# Patient Record
Sex: Female | Born: 1950 | Race: White | Hispanic: No | Marital: Married | Smoking: Never smoker
Health system: Southern US, Community
[De-identification: ages and names within clinical notes are randomized; demographics above are authoritative.]

## PROBLEM LIST (undated history)

## (undated) DIAGNOSIS — I2510 Atherosclerotic heart disease of native coronary artery without angina pectoris: Secondary | ICD-10-CM

## (undated) DIAGNOSIS — M1990 Unspecified osteoarthritis, unspecified site: Secondary | ICD-10-CM

---

## 1998-12-11 ENCOUNTER — Ambulatory Visit (HOSPITAL_COMMUNITY): Admission: RE | Admit: 1998-12-11 | Discharge: 1998-12-11 | Payer: Self-pay | Admitting: Internal Medicine

## 1998-12-11 ENCOUNTER — Encounter: Payer: Self-pay | Admitting: Internal Medicine

## 1999-09-09 ENCOUNTER — Inpatient Hospital Stay (HOSPITAL_COMMUNITY): Admission: RE | Admit: 1999-09-09 | Discharge: 1999-09-10 | Payer: Self-pay | Admitting: Gynecology

## 1999-09-09 ENCOUNTER — Encounter (INDEPENDENT_AMBULATORY_CARE_PROVIDER_SITE_OTHER): Payer: Self-pay | Admitting: Specialist

## 1999-12-29 ENCOUNTER — Encounter: Payer: Self-pay | Admitting: Internal Medicine

## 1999-12-29 ENCOUNTER — Ambulatory Visit (HOSPITAL_COMMUNITY): Admission: RE | Admit: 1999-12-29 | Discharge: 1999-12-29 | Payer: Self-pay | Admitting: Internal Medicine

## 2000-09-07 ENCOUNTER — Other Ambulatory Visit: Admission: RE | Admit: 2000-09-07 | Discharge: 2000-09-07 | Payer: Self-pay | Admitting: Gynecology

## 2001-01-02 ENCOUNTER — Encounter: Payer: Self-pay | Admitting: Internal Medicine

## 2001-01-02 ENCOUNTER — Ambulatory Visit (HOSPITAL_COMMUNITY): Admission: RE | Admit: 2001-01-02 | Discharge: 2001-01-02 | Payer: Self-pay | Admitting: Internal Medicine

## 2001-01-12 ENCOUNTER — Emergency Department (HOSPITAL_COMMUNITY): Admission: EM | Admit: 2001-01-12 | Discharge: 2001-01-12 | Payer: Self-pay | Admitting: Emergency Medicine

## 2001-09-07 ENCOUNTER — Other Ambulatory Visit: Admission: RE | Admit: 2001-09-07 | Discharge: 2001-09-07 | Payer: Self-pay | Admitting: Gynecology

## 2002-01-04 ENCOUNTER — Ambulatory Visit (HOSPITAL_COMMUNITY): Admission: RE | Admit: 2002-01-04 | Discharge: 2002-01-04 | Payer: Self-pay | Admitting: Internal Medicine

## 2002-01-04 ENCOUNTER — Encounter: Payer: Self-pay | Admitting: Internal Medicine

## 2002-06-14 ENCOUNTER — Encounter (INDEPENDENT_AMBULATORY_CARE_PROVIDER_SITE_OTHER): Payer: Self-pay | Admitting: Specialist

## 2002-06-14 ENCOUNTER — Ambulatory Visit (HOSPITAL_BASED_OUTPATIENT_CLINIC_OR_DEPARTMENT_OTHER): Admission: RE | Admit: 2002-06-14 | Discharge: 2002-06-14 | Payer: Self-pay | Admitting: Plastic Surgery

## 2002-09-12 ENCOUNTER — Other Ambulatory Visit: Admission: RE | Admit: 2002-09-12 | Discharge: 2002-09-12 | Payer: Self-pay | Admitting: Gynecology

## 2003-01-07 ENCOUNTER — Ambulatory Visit (HOSPITAL_COMMUNITY): Admission: RE | Admit: 2003-01-07 | Discharge: 2003-01-07 | Payer: Self-pay | Admitting: Gynecology

## 2003-01-07 ENCOUNTER — Encounter: Payer: Self-pay | Admitting: Gynecology

## 2003-09-17 ENCOUNTER — Other Ambulatory Visit: Admission: RE | Admit: 2003-09-17 | Discharge: 2003-09-17 | Payer: Self-pay | Admitting: Gynecology

## 2004-01-09 ENCOUNTER — Ambulatory Visit (HOSPITAL_COMMUNITY): Admission: RE | Admit: 2004-01-09 | Discharge: 2004-01-09 | Payer: Self-pay | Admitting: Gynecology

## 2004-08-16 ENCOUNTER — Emergency Department (HOSPITAL_COMMUNITY): Admission: EM | Admit: 2004-08-16 | Discharge: 2004-08-16 | Payer: Self-pay | Admitting: Emergency Medicine

## 2004-10-06 ENCOUNTER — Other Ambulatory Visit: Admission: RE | Admit: 2004-10-06 | Discharge: 2004-10-06 | Payer: Self-pay | Admitting: Gynecology

## 2005-01-13 ENCOUNTER — Ambulatory Visit (HOSPITAL_COMMUNITY): Admission: RE | Admit: 2005-01-13 | Discharge: 2005-01-13 | Payer: Self-pay | Admitting: Gynecology

## 2005-11-01 ENCOUNTER — Other Ambulatory Visit: Admission: RE | Admit: 2005-11-01 | Discharge: 2005-11-01 | Payer: Self-pay | Admitting: Gynecology

## 2006-01-03 ENCOUNTER — Encounter: Admission: RE | Admit: 2006-01-03 | Discharge: 2006-01-03 | Payer: Self-pay | Admitting: Internal Medicine

## 2006-01-20 ENCOUNTER — Ambulatory Visit (HOSPITAL_COMMUNITY): Admission: RE | Admit: 2006-01-20 | Discharge: 2006-01-20 | Payer: Self-pay | Admitting: Internal Medicine

## 2006-11-03 ENCOUNTER — Other Ambulatory Visit: Admission: RE | Admit: 2006-11-03 | Discharge: 2006-11-03 | Payer: Self-pay | Admitting: Gynecology

## 2007-01-23 ENCOUNTER — Ambulatory Visit (HOSPITAL_COMMUNITY): Admission: RE | Admit: 2007-01-23 | Discharge: 2007-01-23 | Payer: Self-pay | Admitting: Gynecology

## 2007-02-02 ENCOUNTER — Encounter: Admission: RE | Admit: 2007-02-02 | Discharge: 2007-02-02 | Payer: Self-pay | Admitting: Gynecology

## 2007-07-31 ENCOUNTER — Encounter: Admission: RE | Admit: 2007-07-31 | Discharge: 2007-07-31 | Payer: Self-pay | Admitting: Sports Medicine

## 2007-08-14 ENCOUNTER — Encounter: Admission: RE | Admit: 2007-08-14 | Discharge: 2007-08-14 | Payer: Self-pay | Admitting: Sports Medicine

## 2007-11-07 ENCOUNTER — Other Ambulatory Visit: Admission: RE | Admit: 2007-11-07 | Discharge: 2007-11-07 | Payer: Self-pay | Admitting: Gynecology

## 2008-01-24 ENCOUNTER — Encounter: Admission: RE | Admit: 2008-01-24 | Discharge: 2008-01-24 | Payer: Self-pay | Admitting: Gynecology

## 2008-03-22 ENCOUNTER — Encounter: Admission: RE | Admit: 2008-03-22 | Discharge: 2008-03-22 | Payer: Self-pay | Admitting: Sports Medicine

## 2008-04-08 ENCOUNTER — Encounter: Admission: RE | Admit: 2008-04-08 | Discharge: 2008-04-08 | Payer: Self-pay | Admitting: Sports Medicine

## 2012-04-03 ENCOUNTER — Ambulatory Visit (INDEPENDENT_AMBULATORY_CARE_PROVIDER_SITE_OTHER): Payer: BC Managed Care – PPO | Admitting: Sports Medicine

## 2012-04-03 VITALS — BP 130/78 | Ht 65.0 in | Wt 120.0 lb

## 2012-04-03 DIAGNOSIS — M4800 Spinal stenosis, site unspecified: Secondary | ICD-10-CM

## 2012-04-03 DIAGNOSIS — M722 Plantar fascial fibromatosis: Secondary | ICD-10-CM

## 2012-04-03 NOTE — Progress Notes (Signed)
  Subjective:    Patient ID: Beth Clark, female    DOB: January 23, 1951, 61 y.o.   MRN: [id number]  HPI chief complaint: Left leg and left heel pain  Patient comes in today complaining of 6 months of left heel pain. Pain is worse first thing in the morning as well as at the end of the day. No trauma. She's not noticed any swelling. She notices that the pain improves with wearing Birkenstocks. She's also complaining of intermittent tingling up the back of her left calf. No weakness. Tingling seems to be most pronounced with activity. She has a history of spinal stenosis which caused symptoms down the right leg. The symptoms improved with lumbar epidural steroid injections and physical therapy. This was in [id number].Current symptoms in the left leg are a little different than what she experienced in the right. She notices improvement with Mobic but states that she's only able to take it about twice a week do to it causing bloody stools. She's been icing her heel with a frozen water bottle but is otherwise not had any specific treatment.    Review of Systems     Objective:   Physical Exam Well-developed, well-nourished. No acute distress  Neurological exam shows a negative straight leg raise bilaterally. No atrophy of either lower extremity. Reflexes are 3/4 at the Achilles and patellar tendons bilaterally. Strength is 5/5 both lower extremities. Sensation is intact to light-touch.  Left heel: There is tenderness to palpation at the calcaneal insertion of the plantar fascia. Negative calcaneal squeeze. No tenderness to palpation over the medial or lateral malleolus. No soft tissue swelling. Neurovascular intact distally. She walks with an obvious limp. And has a flexible cavus foot.  MSK ultrasound: Limited views of the left heel were obtained in longitudinal view. There is an area of hypoechogenicity at the calcaneal insertion of the plantar fascia. There is a small spur. Plantar fascia measures 3  mm.       Assessment & Plan:  1. Left heel pain secondary to plantar fasciitis 2. Intermittent left leg numbness and tingling likely due to spinal stenosis  Sports insoles and plantar fascial stretches for the left heel. Daily icing with a heel submerged in an ice bath. Arch strap. I recommended good cushioned shoes with walking. For her left leg symptoms, They are not severe enough now to warrant reimaging or ESIs. I discussed the possibility of physical therapy but she wants to hold on that for now. Return to the clinic in 4 weeks.

## 2012-05-01 ENCOUNTER — Ambulatory Visit: Payer: BC Managed Care – PPO | Admitting: Sports Medicine

## 2012-05-03 ENCOUNTER — Ambulatory Visit (INDEPENDENT_AMBULATORY_CARE_PROVIDER_SITE_OTHER): Payer: BC Managed Care – PPO | Admitting: Sports Medicine

## 2012-05-03 VITALS — BP 122/70 | Ht 64.0 in | Wt 120.0 lb

## 2012-05-03 DIAGNOSIS — M25569 Pain in unspecified knee: Secondary | ICD-10-CM | POA: Insufficient documentation

## 2012-05-03 DIAGNOSIS — Q667 Congenital pes cavus, unspecified foot: Secondary | ICD-10-CM

## 2012-05-03 DIAGNOSIS — M722 Plantar fascial fibromatosis: Secondary | ICD-10-CM

## 2012-05-03 NOTE — Progress Notes (Signed)
  Subjective:    Patient ID: Beth Clark, female    DOB: 24-Sep-1950, 61 y.o.   MRN: [id number]  HPI Patient comes in today for followup on left heel pain. Overall pain has improved but not resolved. She still has days where her heel is quite painful. She is wearing her sports insoles as well as her arch strap. She is diligent about doing her home exercises and is trying to be diligent about daily icing. She's beginning to get some mild left knee pain particularly in the anterior aspect of the knee. Pain is present only with squatting. No swelling. No mechanical symptoms. No problems with this knee in the past. The numbness and tingling she was getting down the left leg is now only intermittent and quite tolerable. She's not noticed any weakness.    Review of Systems     Objective:   Physical Exam Well-developed, well-nourished. No acute distress. Awake alert and oriented x3. Vital signs were reviewed and on the chart  Left knee shows full range of motion without an effusion. No tenderness along the patellar tendon. No significant patellofemoral crepitus, but she does have quite a bit of tethering of the patella laterally. Negative patellar grind. Good joint stability. No joint line tenderness. Negative McMurray's. Left foot exam still shows a rather cavus foot. She remains tender to palpation at the calcaneal insertion of the plantar fascia. Negative calcaneal squeeze. Walking without a limp. Neurological exam shows a negative straight leg raise bilaterally. Strength and reflexes are equal bilaterally.       Assessment & Plan:  1. Plantar fasciitis 2. Pes cavus foot 3. 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 Lumbar degenerative disc  Plantar fasciitis is improving. Continue with current treatment, but we will schedule the patient for an appointment next week for custom orthotics. For her left knee pain I gave her a body helix patellar strap. If pain persists  she will need to avoid squats in the gym. Her radiculopathy is currently tolerable. We can consider updated imaging at a later date if symptoms become worse. She understands.

## 2012-05-09 ENCOUNTER — Ambulatory Visit (INDEPENDENT_AMBULATORY_CARE_PROVIDER_SITE_OTHER): Payer: BC Managed Care – PPO | Admitting: Family Medicine

## 2012-05-09 VITALS — BP 120/80 | Ht 64.0 in | Wt 120.0 lb

## 2012-05-09 DIAGNOSIS — R269 Unspecified abnormalities of gait and mobility: Secondary | ICD-10-CM

## 2012-05-09 DIAGNOSIS — M722 Plantar fascial fibromatosis: Secondary | ICD-10-CM

## 2012-05-09 NOTE — Assessment & Plan Note (Signed)
Orthotics made with improvement in gait. Patient is going to increase the duration of wearing orthotics of and course of the next 2 weeks. Patient still has some gait abnormalities would consider focusing on hip abductor strengthening exercises. Patient will followup in 2-4 weeks.

## 2012-05-09 NOTE — Assessment & Plan Note (Signed)
Fitted with custom orthotics today. Patient to followup in 2-4 weeks with Dr. Margaretha Sheffield and have any corrections that would be needed. Encourage patient to increase her duration of wearing the orthotic slowly over the course of the next 2 weeks. Patient though she can bring them at any time to have any adjustments if necessary. Patient will continue her home stretching protocol as well as the anti-inflammatories. If patient does not have improvement in the next 4 weeks they're going to consider corticosteroid injection.

## 2012-05-09 NOTE — Progress Notes (Signed)
Subjective: Patient is here for followup of her plantar fasciitis and pedis cavus. Patient is here to have orthotics made. Patient has been doing the home stretches and icing protocol that was given to her by Dr. Margaretha Sheffield. Patient states that she is having minimal improvement since that time. Patient still says her left foot is worse than the right foot. Patient did do some walking yesterday and had significant pain the next day. Patient has been taking meloxicam with moderate benefit as well. No new symptoms.  Review of systems as stated above in history of present illness otherwise unremarkable  Physical exam: General: No apparent distress alert and oriented x3 Ankle: Bilaterally No visible erythema or swelling. Range of motion is full in all directions. Strength is 5/5 in all directions. Stable lateral and medial ligaments; squeeze test and kleiger test unremarkable; Talar dome nontender; No pain at base of 5th MT; No tenderness over cuboid; No tenderness over N spot or navicular prominence No tenderness on posterior aspects of lateral and medial malleolus No sign of peroneal tendon subluxations; Negative tarsal tunnel tinel's Able to walk 4 steps. Mild varus deformity of the hindfoot bilaterally and pedis cavus bilaterally. Otherwise foot is fairly unremarkable. Gait: Patient has an outtoeing with her gait as well as mild pronation but otherwise fairly unremarkable.   Patient was fitted for a : standard, cushioned, semi-rigid orthotic. The orthotic was heated and afterward the patient stood on the orthotic blank positioned on the orthotic stand. The patient was positioned in subtalar neutral position and 10 degrees of ankle dorsiflexion in a weight bearing stance. After completion of molding, a stable base was applied to the orthotic blank. The blank was ground to a stable position for weight bearing. Size:9 Base:EVA Posting:none Additional orthotic padding: had to sand down toe to  help with sizing in shoe.   Patient's gait improved significantly with much more neutral position with orthotics. Patient's outoeing also improved.

## 2012-06-07 ENCOUNTER — Ambulatory Visit: Payer: BC Managed Care – PPO | Admitting: Sports Medicine

## 2013-12-04 DIAGNOSIS — W57XXXA Bitten or stung by nonvenomous insect and other nonvenomous arthropods, initial encounter: Secondary | ICD-10-CM | POA: Diagnosis not present

## 2013-12-04 DIAGNOSIS — T148 Other injury of unspecified body region: Secondary | ICD-10-CM | POA: Diagnosis not present

## 2013-12-04 DIAGNOSIS — Z85828 Personal history of other malignant neoplasm of skin: Secondary | ICD-10-CM | POA: Diagnosis not present

## 2014-03-04 DIAGNOSIS — W57XXXA Bitten or stung by nonvenomous insect and other nonvenomous arthropods, initial encounter: Secondary | ICD-10-CM | POA: Diagnosis not present

## 2014-03-04 DIAGNOSIS — Z1231 Encounter for screening mammogram for malignant neoplasm of breast: Secondary | ICD-10-CM | POA: Diagnosis not present

## 2014-03-04 DIAGNOSIS — D239 Other benign neoplasm of skin, unspecified: Secondary | ICD-10-CM | POA: Diagnosis not present

## 2014-03-04 DIAGNOSIS — Z1289 Encounter for screening for malignant neoplasm of other sites: Secondary | ICD-10-CM | POA: Diagnosis not present

## 2014-03-04 DIAGNOSIS — D230 Other benign neoplasm of skin of lip: Secondary | ICD-10-CM | POA: Diagnosis not present

## 2014-03-04 DIAGNOSIS — Z01419 Encounter for gynecological examination (general) (routine) without abnormal findings: Secondary | ICD-10-CM | POA: Diagnosis not present

## 2014-03-04 DIAGNOSIS — Z85828 Personal history of other malignant neoplasm of skin: Secondary | ICD-10-CM | POA: Diagnosis not present

## 2014-03-04 DIAGNOSIS — D2330 Other benign neoplasm of skin of unspecified part of face: Secondary | ICD-10-CM | POA: Diagnosis not present

## 2014-03-04 DIAGNOSIS — L821 Other seborrheic keratosis: Secondary | ICD-10-CM | POA: Diagnosis not present

## 2014-03-04 DIAGNOSIS — T148 Other injury of unspecified body region: Secondary | ICD-10-CM | POA: Diagnosis not present

## 2014-03-04 DIAGNOSIS — D1801 Hemangioma of skin and subcutaneous tissue: Secondary | ICD-10-CM | POA: Diagnosis not present

## 2014-03-04 DIAGNOSIS — Z1212 Encounter for screening for malignant neoplasm of rectum: Secondary | ICD-10-CM | POA: Diagnosis not present

## 2014-05-01 ENCOUNTER — Encounter: Payer: Self-pay | Admitting: Sports Medicine

## 2014-05-01 ENCOUNTER — Ambulatory Visit (INDEPENDENT_AMBULATORY_CARE_PROVIDER_SITE_OTHER): Payer: Medicare Other | Admitting: Sports Medicine

## 2014-05-01 VITALS — BP 139/74 | Ht 65.0 in | Wt 125.0 lb

## 2014-05-01 DIAGNOSIS — M25569 Pain in unspecified knee: Secondary | ICD-10-CM | POA: Diagnosis not present

## 2014-05-01 DIAGNOSIS — R269 Unspecified abnormalities of gait and mobility: Secondary | ICD-10-CM | POA: Diagnosis not present

## 2014-05-01 DIAGNOSIS — M48061 Spinal stenosis, lumbar region without neurogenic claudication: Secondary | ICD-10-CM

## 2014-05-01 DIAGNOSIS — M2022 Hallux rigidus, left foot: Secondary | ICD-10-CM

## 2014-05-01 DIAGNOSIS — M2020 Hallux rigidus, unspecified foot: Secondary | ICD-10-CM

## 2014-05-01 DIAGNOSIS — M722 Plantar fascial fibromatosis: Secondary | ICD-10-CM | POA: Diagnosis not present

## 2014-05-02 NOTE — Progress Notes (Signed)
   Subjective:    Patient ID: Beth Clark, female    DOB: 03/07/1951, 63 y.o.   MRN: [id number]  HPI chief complaint: Bilateral foot pain  Patient comes in today complaining of bilateral foot pain, right greater than left. Her pain is diffuse but in the right foot she is also getting some discomfort in her right great toe. She feels like the mobility is less than in her left foot. She describes it as a "rubber band type sensation" around her toe. She has a history of spinal stenosis and has had previous lumbar epidural steroid injections. She notes some chronic decreased sensation along the right lower leg and into the great toe from her previous lumbar spine issues. She is very reactive and participate in a spinning class III days a week. She also does go to as well as a body sculpting class. The discomfort in her feet is worse first thing in the morning. She's not noticed any swelling. She was previously seen in the office back in September of 2013 for plantar fasciitis. That has resolved. She denies numbness and tingling in her feet but she does note some paresthesias in the left calf which is present when going from a seated to a standing position. This discomfort will resolve if she is able to shift her weight around. She feels like both of her legs are weak. She denies any groin pain. She has tried multiple oral and topical anti-inflammatories.  Interim medical history reviewed  Current medications reviewed Allergies reviewed    Review of Systems     Objective:   Physical Exam Well-developed, fit-appearing. No acute distress. Awake alert and oriented x3. Negative straight leg raise bilaterally. There is a slight amount of weakness with resisted great toe extension on the right compared to the left but otherwise her strength is 5/5 bilaterally. No noticeable atrophy. There is some decreased sensation along the L4 and L5 dermatomes in the right lower leg but this is chronic. Once again  appreciated is a cavus foot. She has hallux rigidus on the right but no palpable tenderness. No soft tissue swelling. No joint effusion. Walking without a limp.       Assessment & Plan:  Diffuse bilateral feet pain Right hallux rigidus History of spinal stenosis  I inspected her custom orthotics. They are in good shape. I added a first ray post to the right orthotic. I've also given her some green sports insoles to put in her spin shoes. I suspect that a lot of her diffuse discomfort is from the rigidity of the shoe. The paresthesias that she is experiencing intermittently in her left calf are likely originating from her lumbar spine given her history of spinal stenosis. I have requested the records from [facility] and I will see her back in the office in 4 weeks. I think she is okay to continue all activities as tolerated. If her paresthesias become more frequent or more constant or she begins to develop any weakness then I would consider repeating her MRI to reevaluate the degree of spinal stenosis present.

## 2014-06-03 ENCOUNTER — Ambulatory Visit (INDEPENDENT_AMBULATORY_CARE_PROVIDER_SITE_OTHER): Payer: Medicare Other | Admitting: Sports Medicine

## 2014-06-03 ENCOUNTER — Encounter: Payer: Self-pay | Admitting: Sports Medicine

## 2014-06-03 VITALS — BP 126/81 | HR 81 | Ht 64.0 in | Wt 120.0 lb

## 2014-06-03 DIAGNOSIS — M545 Low back pain, unspecified: Secondary | ICD-10-CM

## 2014-06-03 DIAGNOSIS — M79674 Pain in right toe(s): Secondary | ICD-10-CM

## 2014-06-04 ENCOUNTER — Encounter: Payer: Self-pay | Admitting: Sports Medicine

## 2014-06-04 ENCOUNTER — Ambulatory Visit
Admission: RE | Admit: 2014-06-04 | Discharge: 2014-06-04 | Disposition: A | Discharge status: Home / Self Care | Payer: Medicare Other | Source: Ambulatory Visit | Attending: Sports Medicine | Admitting: Sports Medicine

## 2014-06-04 DIAGNOSIS — M545 Low back pain, unspecified: Secondary | ICD-10-CM

## 2014-06-04 DIAGNOSIS — M79674 Pain in right toe(s): Secondary | ICD-10-CM | POA: Diagnosis not present

## 2014-06-04 DIAGNOSIS — M5136 Other intervertebral disc degeneration, lumbar region: Secondary | ICD-10-CM | POA: Diagnosis not present

## 2014-06-04 DIAGNOSIS — S99921A Unspecified injury of right foot, initial encounter: Secondary | ICD-10-CM | POA: Diagnosis not present

## 2014-06-04 DIAGNOSIS — M19071 Primary osteoarthritis, right ankle and foot: Secondary | ICD-10-CM | POA: Diagnosis not present

## 2014-06-04 NOTE — Progress Notes (Signed)
   Subjective:    Patient ID: Beth Clark, female    DOB: 06-17-1951, 63 y.o.   MRN: [id number]  HPI Patient comes in today for followup. She is still experiencing some "discomfort" in her right great toe. She describes it as a tightness and a "rubber bands tight feeling". No significant pain. No associated numbness or tingling although she does continue to complain of tingling in the left leg and left calf intermittently. Tingling is present mainly when going from a seated to a standing position. Resolves quickly with walking. No weakness. No pain with sitting. She does feel like a first ray post added to her orthotic on the right was helpful.  I was able to review the records from [facility]. In 2008 she had an MRI of her lumbar spine which showed moderate central and right foraminal stenosis with an associated posterior lateral disc bulge with compression of the right L5 nerve root. She underwent epidural steroid injections but despite this was left with some chronic mild numbness in the L5 nerve root distribution.    Review of Systems     Objective:   Physical Exam Well-developed, fit-appearing. No acute distress. Sitting comfortable in exam room  Negative straight leg raise bilaterally. Slight amount of weakness with great toe extension on the right compared to the left but not severe. Remainder of her strength is 5/5 in both lower extremities. Reflexes are equal at the Achilles and patellar tendons bilaterally. Some decreased sensation along the L5 dermatome on the right persists that this is chronic and there is no associated atrophy. Right great toe shows some mild hallux rigidus when compared to the left. No swelling. No tenderness to palpation. Patient walks without a limp.       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 We discussed working this up further with x-ray and MRI. Patient is in agreement with the x-ray but does not feel like her symptoms are severe enough toward a repeat MRI at this time. She understands that if her symptoms worsen or she begins to develop weakness in either leg then we will need to reconsider this. I'm also going to get x-rays of her right great toe to rule out possible osteoarthritis here. I will call her with the x-ray findings once available. She will continue with her orthotics and first ray post on the right with activity.

## 2014-06-06 ENCOUNTER — Telehealth: Payer: Self-pay | Admitting: Sports Medicine

## 2014-06-06 NOTE — Telephone Encounter (Signed)
I spoke with the patient on the phone today after reviewing x-rays of both her right great toe and her lumbar spine. Right great toe shows some mild to moderate degenerative changes at the MTP joint. Her lumbar spine shows some stable degenerative disc disease at L4-L5 as well as some facet arthropathy. In regards to her great toe she is having very little in the way of pain. Her symptoms may be due to the first MTP osteoarthritis or her symptoms may also be due to referred pain from her lumbar spine. Either way, her symptoms do not warrant more aggressive management at this time. She does understand that her spinal stenosis cannot be fully evaluated without doing a repeat MRI but as long as she is not having worsening symptoms I think that we can hold on that for now. She will followup if her symptoms worsen.

## 2014-06-30 DIAGNOSIS — Z23 Encounter for immunization: Secondary | ICD-10-CM | POA: Diagnosis not present

## 2014-11-01 DIAGNOSIS — Z0000 Encounter for general adult medical examination without abnormal findings: Secondary | ICD-10-CM | POA: Diagnosis not present

## 2014-11-01 DIAGNOSIS — E780 Pure hypercholesterolemia: Secondary | ICD-10-CM | POA: Diagnosis not present

## 2014-11-01 DIAGNOSIS — G4700 Insomnia, unspecified: Secondary | ICD-10-CM | POA: Diagnosis not present

## 2014-11-01 DIAGNOSIS — E559 Vitamin D deficiency, unspecified: Secondary | ICD-10-CM | POA: Diagnosis not present

## 2014-11-01 DIAGNOSIS — Z23 Encounter for immunization: Secondary | ICD-10-CM | POA: Diagnosis not present

## 2014-11-01 DIAGNOSIS — M899 Disorder of bone, unspecified: Secondary | ICD-10-CM | POA: Diagnosis not present

## 2014-11-01 DIAGNOSIS — M545 Low back pain: Secondary | ICD-10-CM | POA: Diagnosis not present

## 2014-11-04 ENCOUNTER — Ambulatory Visit (INDEPENDENT_AMBULATORY_CARE_PROVIDER_SITE_OTHER): Payer: Medicare Other | Admitting: Sports Medicine

## 2014-11-04 ENCOUNTER — Encounter: Payer: Self-pay | Admitting: Sports Medicine

## 2014-11-04 VITALS — BP 156/81 | HR 81 | Ht 64.0 in | Wt 126.0 lb

## 2014-11-04 DIAGNOSIS — M25511 Pain in right shoulder: Secondary | ICD-10-CM | POA: Diagnosis not present

## 2014-11-04 NOTE — Progress Notes (Signed)
  MAICEY BARRIENTEZ - 64 y.o. female MRN [id number]  Date of birth: [birth year]  SUBJECTIVE:  Including CC & ROS.  No chief complaint on file.  Anelia Carriveau Lyman is a 64 y.o. presents for evaluation of right shoulder pain. States pain started about 1 year ago. Pain located primarily front/top of shoulder. Occurs primarily with specific exercises like chest presses, some over the shoulder weight exercises. She only notices the pain during her everyday routine when she is reaching behind her back to pull covers over herself in bed. She has taken time off from exercises that cause her issue but when returning to them she again has a similar pain. It has not gotten worse, but came to be evaluated because it has not gotten any better. She takes meloxicam about once a week and aleve several times a week, she is not sure if it has helped at all   HISTORY: Past Medical, Surgical, Social, and Family History Reviewed & Updated per EMR.   Pertinent Historical Findings include: none  PHYSICAL EXAM:  VS: BP:(!) 156/81 mmHg  HR:81bpm  TEMP: ( )  RESP:   HT:5\' 4"  (162.6 cm)   WT:126 lb (57.153 kg)  BMI:21.7 PHYSICAL EXAM: General: NAD Shoulder exam: INSPECTION: prominent clavicles bilaterally.  PALPATION: mild tenderness primarily AC joint RANGE OF MOTION: intact STRENGTH: 5/5 arm extension/flexion, shoulder abduction/adduction, shoulder internal/external rotation SPECIAL TESTS: AC adduction test equivocal/mild pain on the right. Negative empty can. Hawkins test positive on first attempt, negative on second. Negative O'Briens. NEUROVASCULAR STATUS: 2+ radial pulses bilaterally. Sensation grossly intact. 2+ biceps and brachioradialis reflexes bilaterally, 2+ triceps on left, 1+ triceps on right.    ASSESSMENT & PLAN: See problem based charting & AVS for pt instructions.

## 2014-11-04 NOTE — Assessment & Plan Note (Addendum)
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 Overall good rotator cuff strength, less likely rotator cuff or labral etiology. Counseled regarding conservative management: avoid exercises causing her pain, decrease weights for chest presses from 15lbs to 7.5lbs, avoid overhead presses. Can continue using OTC pain medications as needed. I've recommended topical Aspercreme. If symptoms persist or worsen I would start with plain x-rays of the right ac joint. Follow-up for ongoing or recalcitrant issues.  Patient was seen and evaluated with the above-named resident. I agree with the plan of care.

## 2014-12-26 DIAGNOSIS — Z1211 Encounter for screening for malignant neoplasm of colon: Secondary | ICD-10-CM | POA: Diagnosis not present

## 2014-12-26 DIAGNOSIS — K635 Polyp of colon: Secondary | ICD-10-CM | POA: Diagnosis not present

## 2014-12-26 DIAGNOSIS — D125 Benign neoplasm of sigmoid colon: Secondary | ICD-10-CM | POA: Diagnosis not present

## 2014-12-26 DIAGNOSIS — K640 First degree hemorrhoids: Secondary | ICD-10-CM | POA: Diagnosis not present

## 2015-03-04 DIAGNOSIS — D1801 Hemangioma of skin and subcutaneous tissue: Secondary | ICD-10-CM | POA: Diagnosis not present

## 2015-03-04 DIAGNOSIS — D2262 Melanocytic nevi of left upper limb, including shoulder: Secondary | ICD-10-CM | POA: Diagnosis not present

## 2015-03-04 DIAGNOSIS — L812 Freckles: Secondary | ICD-10-CM | POA: Diagnosis not present

## 2015-03-04 DIAGNOSIS — D225 Melanocytic nevi of trunk: Secondary | ICD-10-CM | POA: Diagnosis not present

## 2015-03-04 DIAGNOSIS — Z85828 Personal history of other malignant neoplasm of skin: Secondary | ICD-10-CM | POA: Diagnosis not present

## 2015-03-04 DIAGNOSIS — D2261 Melanocytic nevi of right upper limb, including shoulder: Secondary | ICD-10-CM | POA: Diagnosis not present

## 2015-03-04 DIAGNOSIS — L821 Other seborrheic keratosis: Secondary | ICD-10-CM | POA: Diagnosis not present

## 2015-03-27 DIAGNOSIS — Z01419 Encounter for gynecological examination (general) (routine) without abnormal findings: Secondary | ICD-10-CM | POA: Diagnosis not present

## 2015-03-27 DIAGNOSIS — Z1231 Encounter for screening mammogram for malignant neoplasm of breast: Secondary | ICD-10-CM | POA: Diagnosis not present

## 2015-03-27 DIAGNOSIS — N958 Other specified menopausal and perimenopausal disorders: Secondary | ICD-10-CM | POA: Diagnosis not present

## 2015-03-27 DIAGNOSIS — M816 Localized osteoporosis [Lequesne]: Secondary | ICD-10-CM | POA: Diagnosis not present

## 2015-03-27 DIAGNOSIS — Z6821 Body mass index (BMI) 21.0-21.9, adult: Secondary | ICD-10-CM | POA: Diagnosis not present

## 2015-09-11 ENCOUNTER — Other Ambulatory Visit: Payer: Self-pay | Admitting: Sports Medicine

## 2015-09-11 ENCOUNTER — Ambulatory Visit
Admission: RE | Admit: 2015-09-11 | Discharge: 2015-09-11 | Disposition: A | Discharge status: Home / Self Care | Payer: Medicare Other | Source: Ambulatory Visit | Attending: Sports Medicine | Admitting: Sports Medicine

## 2015-09-11 ENCOUNTER — Ambulatory Visit (INDEPENDENT_AMBULATORY_CARE_PROVIDER_SITE_OTHER): Payer: Medicare Other | Admitting: Sports Medicine

## 2015-09-11 ENCOUNTER — Encounter: Payer: Self-pay | Admitting: Sports Medicine

## 2015-09-11 VITALS — BP 156/71 | Ht 64.5 in | Wt 125.0 lb

## 2015-09-11 DIAGNOSIS — M25562 Pain in left knee: Secondary | ICD-10-CM | POA: Diagnosis not present

## 2015-09-11 DIAGNOSIS — M542 Cervicalgia: Secondary | ICD-10-CM | POA: Diagnosis present

## 2015-09-11 DIAGNOSIS — M25561 Pain in right knee: Secondary | ICD-10-CM

## 2015-09-11 DIAGNOSIS — M50323 Other cervical disc degeneration at C6-C7 level: Secondary | ICD-10-CM | POA: Diagnosis not present

## 2015-09-11 MED ORDER — TRAMADOL HCL 50 MG PO TABS: 50.0000 mg | ORAL_TABLET | Freq: Every day | ORAL | Status: DC

## 2015-09-11 NOTE — Progress Notes (Signed)
Subjective:    Patient ID: Beth Clark, female    DOB: Oct 14, 1950, 65 y.o.   MRN: [id number]  HPI Patient is a 65 year-old female who presents for evaluation of neck pain. Pain is primarily of left neck and radiates from the occiput into trapezius area. The pain does not radiate into the shoulder or down the arm. Patient denies any numbness, tingling or weakness of the arms or hands. The pain began roughly 9 months ago and at first only occurred occasionally but is now bothering her on a daily basis. She describes the pain as a muscle soreness type pain that is worse at night when she is moving around. Tilting her head down and to the right also causes the pain in her left neck. The pain tends to be worse at night and in the early morning and she notices it less during the day when she is more active. She is very active and participates in spin classes as well as other daily exercise activities and wants to be able to continue to do these things in the future. She also has some right neck pain that is similar in quality if she has been looking down reading for a long period of time. She tried a one week course of nightly aleve with only slight improvement and occasionally tried a meloxicam however this upsets her stomach. She has not tried any other remedies including ice, heat or massage. She feels the left neck pain may be greater due to some over-compensation from a right shoulder injury she had roughly one year ago. Patient also complains of knee pain that is worse when bending over, squatting or stooping. This has been a chronic issue along with back pain for her.   She is also complaining of chronic bilateral knee pain which is worse with squatting. She is wondering whether or not she has arthritis. No swelling. No mechanical symptoms. No pain with simple walking. It really does seem to be strictly related to squatting. No prior knee surgeries. No numbness or tingling.  Review of  Systems Otherwise negative except as specified in HPI    Objective:   Physical Exam General: Caucasian female resting on exam table in no acute distress Neck: patient has full cervical range of motion in all planes. No tenderness along the cervical midline or paraspinal musculature. No spasm. Negative Spurling's. No focal neurological deficits of either upper extremity.  Knees: Full range of motion. No effusion. No significant patellofemoral crepitus. Negative McMurray's. Good joint stability. Neurovascularly intact distally. Walking without a limp.      Assessment & Plan:  1) Neck Pain (primarily left sided) potentially secondary to osteoarthritis involving facet joints -X-ray C-spine AP+Lateral -Referral to [facility] for physical therapy targeting C-spine and neck discomfort -Tramadol 50mg  daily as needed at bedtime for pain -Recommended use of heat to alleviate discomfort when bothering her -Follow-up to assess progress in 1 month. If symptoms persist or worsen consider further diagnostic imaging  2) Knee Pain -X-ray standing AP, lateral and sunrise to evaluate for Osteoarthritis -If has OA patient interested in being involved in Knee OA trial  Addendum: X-rays of her cervical spine and both knees are reviewed. Cervical spine x-ray does show some moderate disc space narrowing at C6-C7 and C7-T1. X-rays of her knees show no significant degenerative joint disease. She does have calcification at the insertion of the quadriceps tendons bilaterally, left greater than right. This is no doubt the reason for her chronic pain  with squatting. At follow-up, I will show her some eccentric quad exercises for this.

## 2015-09-30 DIAGNOSIS — M5030 Other cervical disc degeneration, unspecified cervical region: Secondary | ICD-10-CM | POA: Diagnosis not present

## 2015-10-09 DIAGNOSIS — M5030 Other cervical disc degeneration, unspecified cervical region: Secondary | ICD-10-CM | POA: Diagnosis not present

## 2015-10-13 ENCOUNTER — Ambulatory Visit (INDEPENDENT_AMBULATORY_CARE_PROVIDER_SITE_OTHER): Payer: Medicare Other | Admitting: Sports Medicine

## 2015-10-13 ENCOUNTER — Encounter: Payer: Self-pay | Admitting: Sports Medicine

## 2015-10-13 VITALS — BP 136/80 | Ht 64.5 in | Wt 125.0 lb

## 2015-10-13 DIAGNOSIS — M79672 Pain in left foot: Secondary | ICD-10-CM

## 2015-10-13 DIAGNOSIS — M542 Cervicalgia: Secondary | ICD-10-CM | POA: Diagnosis not present

## 2015-10-13 NOTE — Progress Notes (Signed)
   Subjective:    Patient ID: Beth Clark, female    DOB: [birth year], 65 y.o.   MRN: [id number]  HPI Patient is a 65 year-old female who presents for f/u of neck pain. Pain is primarily of left neck and radiates from the occiput into trapezius area. The pain does not radiate into the shoulder or down the arm. She has been working with PT and doing HEP which she thinks is helping quite a bit. She is working on her posture and how she holds her head while reading. The pain at night is getting a lot better. She still has pain with flexion and lateral bending of neck.   Review of Systems Otherwise negative except as specified in HPI    Objective:   Physical Exam General: Caucasian female resting on exam table in no acute distress Neck: patient has full cervical range of motion in all planes. No tenderness along the cervical midline or paraspinal musculature. No spasm. Negative Spurling's. No focal neurological deficits of either upper extremity.     Assessment & Plan:  1) Neck Pain (primarily left sided) likely secondary to osteoarthritis involving facet joints and DDD evidenced by disk space narrow at c6-c7 and c7-t1 -continue physical therapy targeting C-spine and neck discomfort -Tramadol 50mg  daily as needed at bedtime for pain -Recommended use of heat to alleviate discomfort when bothering her -Follow-up as needed if pain does not continue to improve or if other issues arise   Patient seen and evaluated with the resident. I agree with the above plan of care. Patient is doing better with physical therapy. She will continue with formal PT until she weans to a home exercise program. I do not see the need for further diagnostic imaging at this point in time. She may discontinue her tramadol if she does not find it helpful.

## 2015-10-15 DIAGNOSIS — M5030 Other cervical disc degeneration, unspecified cervical region: Secondary | ICD-10-CM | POA: Diagnosis not present

## 2015-10-23 DIAGNOSIS — M5030 Other cervical disc degeneration, unspecified cervical region: Secondary | ICD-10-CM | POA: Diagnosis not present

## 2015-10-28 DIAGNOSIS — M5030 Other cervical disc degeneration, unspecified cervical region: Secondary | ICD-10-CM | POA: Diagnosis not present

## 2015-11-10 DIAGNOSIS — M5030 Other cervical disc degeneration, unspecified cervical region: Secondary | ICD-10-CM | POA: Diagnosis not present

## 2015-11-11 DIAGNOSIS — Z1389 Encounter for screening for other disorder: Secondary | ICD-10-CM | POA: Diagnosis not present

## 2015-11-11 DIAGNOSIS — Z0000 Encounter for general adult medical examination without abnormal findings: Secondary | ICD-10-CM | POA: Diagnosis not present

## 2015-11-11 DIAGNOSIS — G4700 Insomnia, unspecified: Secondary | ICD-10-CM | POA: Diagnosis not present

## 2015-11-11 DIAGNOSIS — E559 Vitamin D deficiency, unspecified: Secondary | ICD-10-CM | POA: Diagnosis not present

## 2015-11-11 DIAGNOSIS — E7800 Pure hypercholesterolemia, unspecified: Secondary | ICD-10-CM | POA: Diagnosis not present

## 2015-11-11 DIAGNOSIS — M899 Disorder of bone, unspecified: Secondary | ICD-10-CM | POA: Diagnosis not present

## 2015-11-18 DIAGNOSIS — M5030 Other cervical disc degeneration, unspecified cervical region: Secondary | ICD-10-CM | POA: Diagnosis not present

## 2016-01-16 DIAGNOSIS — R002 Palpitations: Secondary | ICD-10-CM | POA: Diagnosis not present

## 2016-01-19 DIAGNOSIS — R002 Palpitations: Secondary | ICD-10-CM | POA: Insufficient documentation

## 2016-01-20 ENCOUNTER — Ambulatory Visit (INDEPENDENT_AMBULATORY_CARE_PROVIDER_SITE_OTHER): Payer: Medicare Other

## 2016-01-20 DIAGNOSIS — R002 Palpitations: Secondary | ICD-10-CM | POA: Diagnosis not present

## 2016-03-09 DIAGNOSIS — L814 Other melanin hyperpigmentation: Secondary | ICD-10-CM | POA: Diagnosis not present

## 2016-03-09 DIAGNOSIS — L821 Other seborrheic keratosis: Secondary | ICD-10-CM | POA: Diagnosis not present

## 2016-03-09 DIAGNOSIS — D1801 Hemangioma of skin and subcutaneous tissue: Secondary | ICD-10-CM | POA: Diagnosis not present

## 2016-03-09 DIAGNOSIS — D225 Melanocytic nevi of trunk: Secondary | ICD-10-CM | POA: Diagnosis not present

## 2016-03-09 DIAGNOSIS — Z85828 Personal history of other malignant neoplasm of skin: Secondary | ICD-10-CM | POA: Diagnosis not present

## 2016-03-09 DIAGNOSIS — L570 Actinic keratosis: Secondary | ICD-10-CM | POA: Diagnosis not present

## 2016-03-09 DIAGNOSIS — D2261 Melanocytic nevi of right upper limb, including shoulder: Secondary | ICD-10-CM | POA: Diagnosis not present

## 2016-04-13 DIAGNOSIS — Z1231 Encounter for screening mammogram for malignant neoplasm of breast: Secondary | ICD-10-CM | POA: Diagnosis not present

## 2016-04-13 DIAGNOSIS — Z124 Encounter for screening for malignant neoplasm of cervix: Secondary | ICD-10-CM | POA: Diagnosis not present

## 2016-04-13 DIAGNOSIS — Z6821 Body mass index (BMI) 21.0-21.9, adult: Secondary | ICD-10-CM | POA: Diagnosis not present

## 2016-04-13 DIAGNOSIS — Z1272 Encounter for screening for malignant neoplasm of vagina: Secondary | ICD-10-CM | POA: Diagnosis not present

## 2016-08-11 DIAGNOSIS — Z23 Encounter for immunization: Secondary | ICD-10-CM | POA: Diagnosis not present

## 2016-10-26 DIAGNOSIS — L821 Other seborrheic keratosis: Secondary | ICD-10-CM | POA: Diagnosis not present

## 2016-10-26 DIAGNOSIS — Z85828 Personal history of other malignant neoplasm of skin: Secondary | ICD-10-CM | POA: Diagnosis not present

## 2016-11-30 DIAGNOSIS — M810 Age-related osteoporosis without current pathological fracture: Secondary | ICD-10-CM | POA: Diagnosis not present

## 2016-11-30 DIAGNOSIS — E7800 Pure hypercholesterolemia, unspecified: Secondary | ICD-10-CM | POA: Diagnosis not present

## 2016-11-30 DIAGNOSIS — Z0000 Encounter for general adult medical examination without abnormal findings: Secondary | ICD-10-CM | POA: Diagnosis not present

## 2016-11-30 DIAGNOSIS — Z1389 Encounter for screening for other disorder: Secondary | ICD-10-CM | POA: Diagnosis not present

## 2016-11-30 DIAGNOSIS — G4700 Insomnia, unspecified: Secondary | ICD-10-CM | POA: Diagnosis not present

## 2016-12-21 IMAGING — CR DG CERVICAL SPINE 2 OR 3 VIEWS
3 series · 3 of 3 positions shown · non-contrast
Comparison: None.

CLINICAL DATA: Cervicalgia with radicular symptoms into the left
shoulder, chronic

EXAM:
CERVICAL SPINE - 2-3 VIEW

[w c-spine lat]
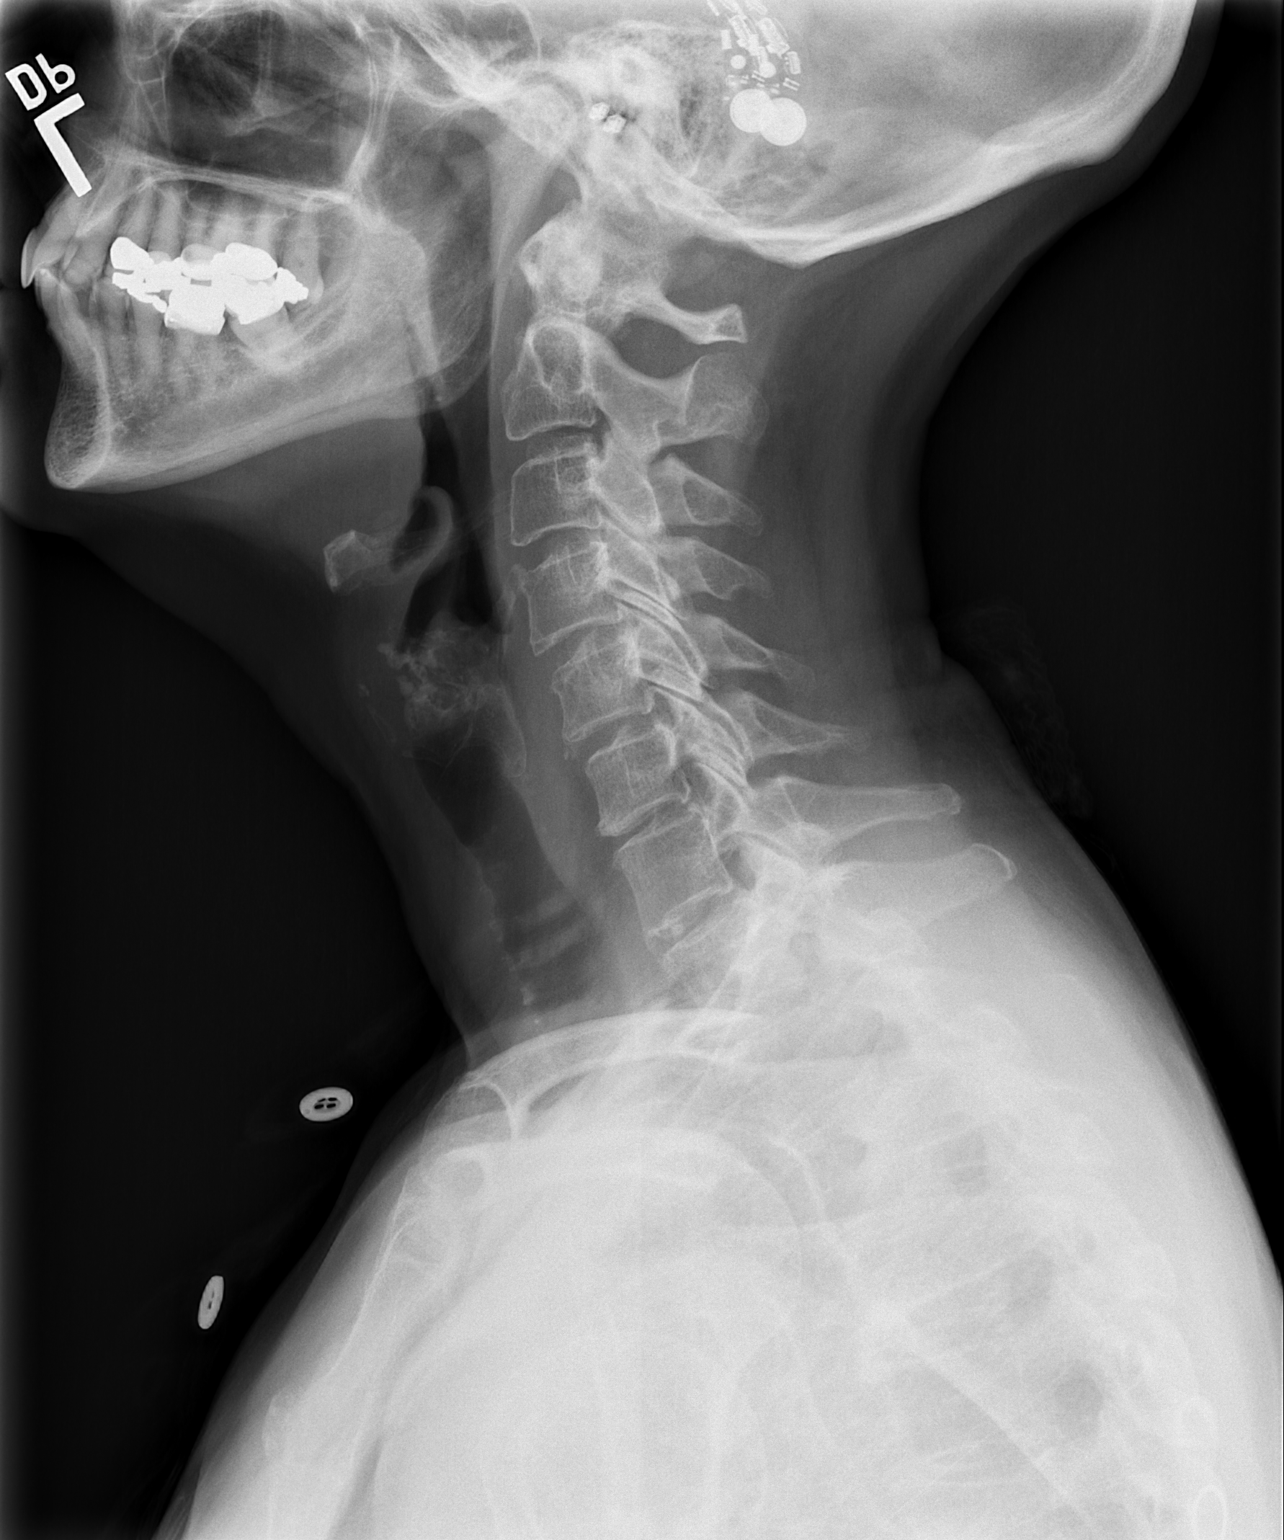

[w c-spine a.p. *]
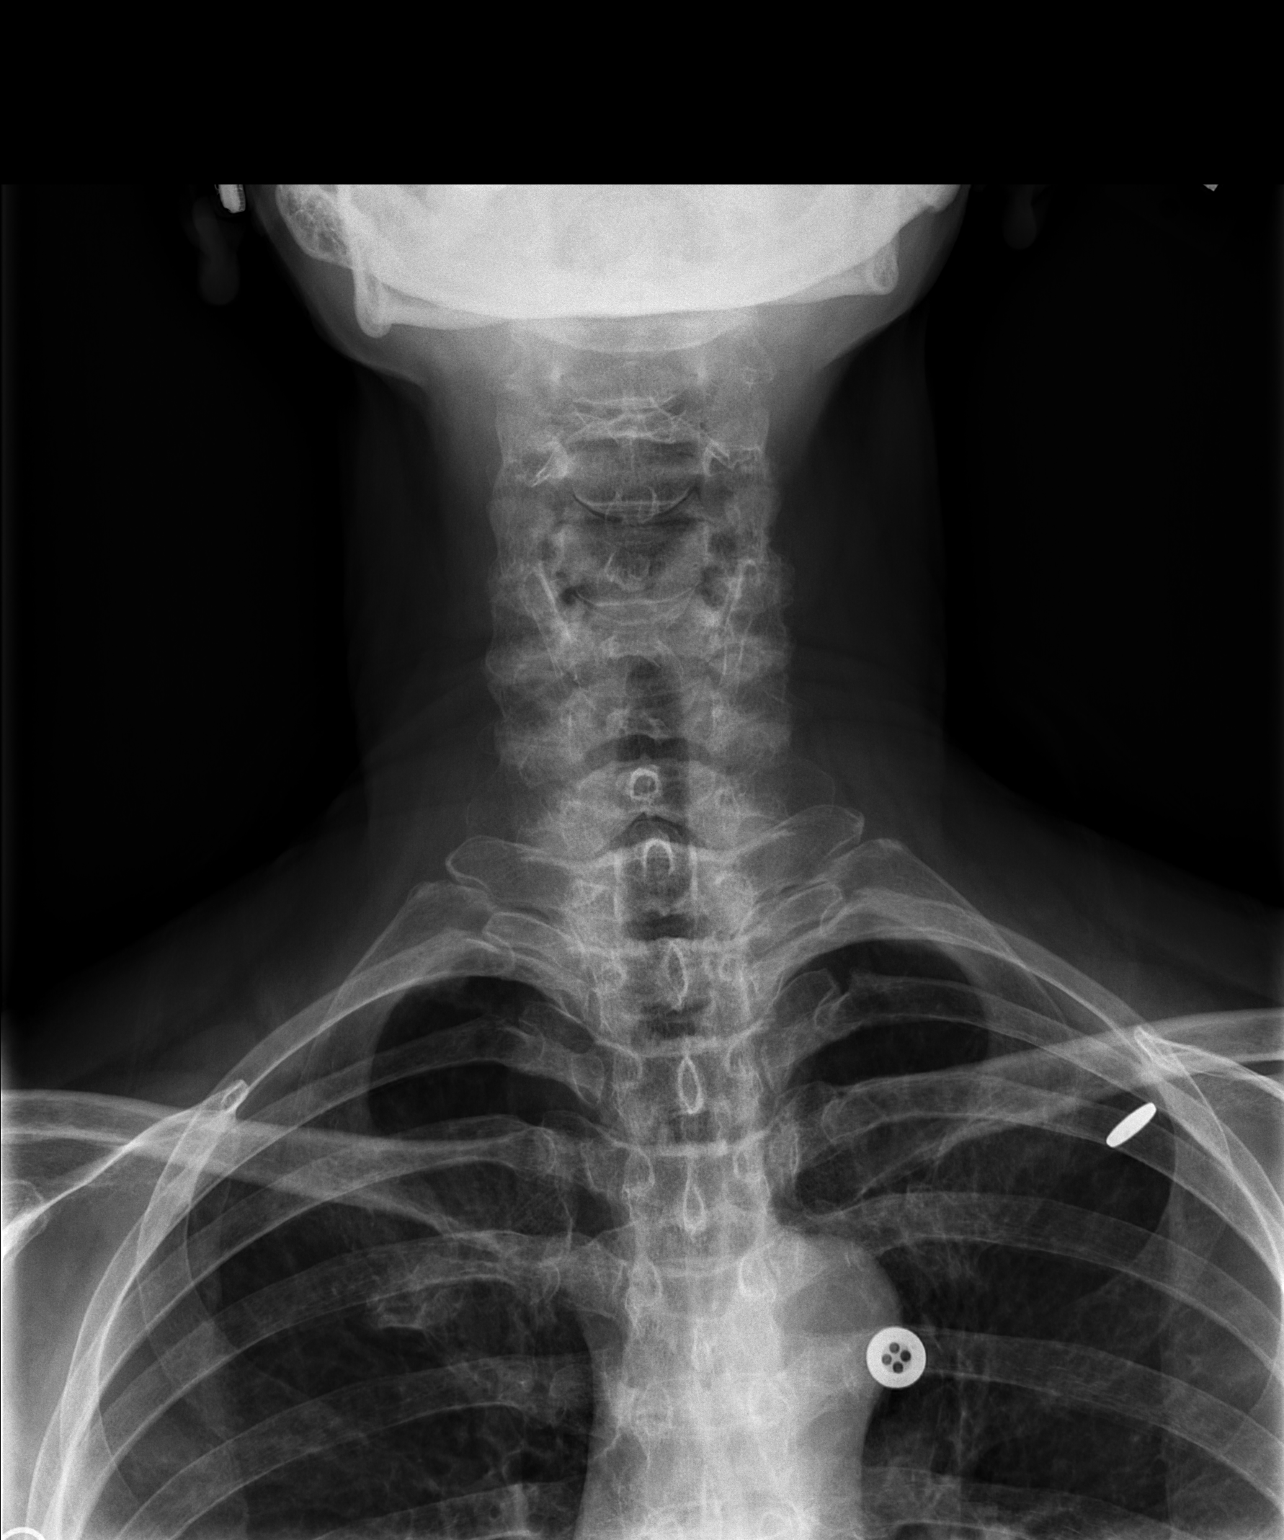

[w c-spine odontoid]
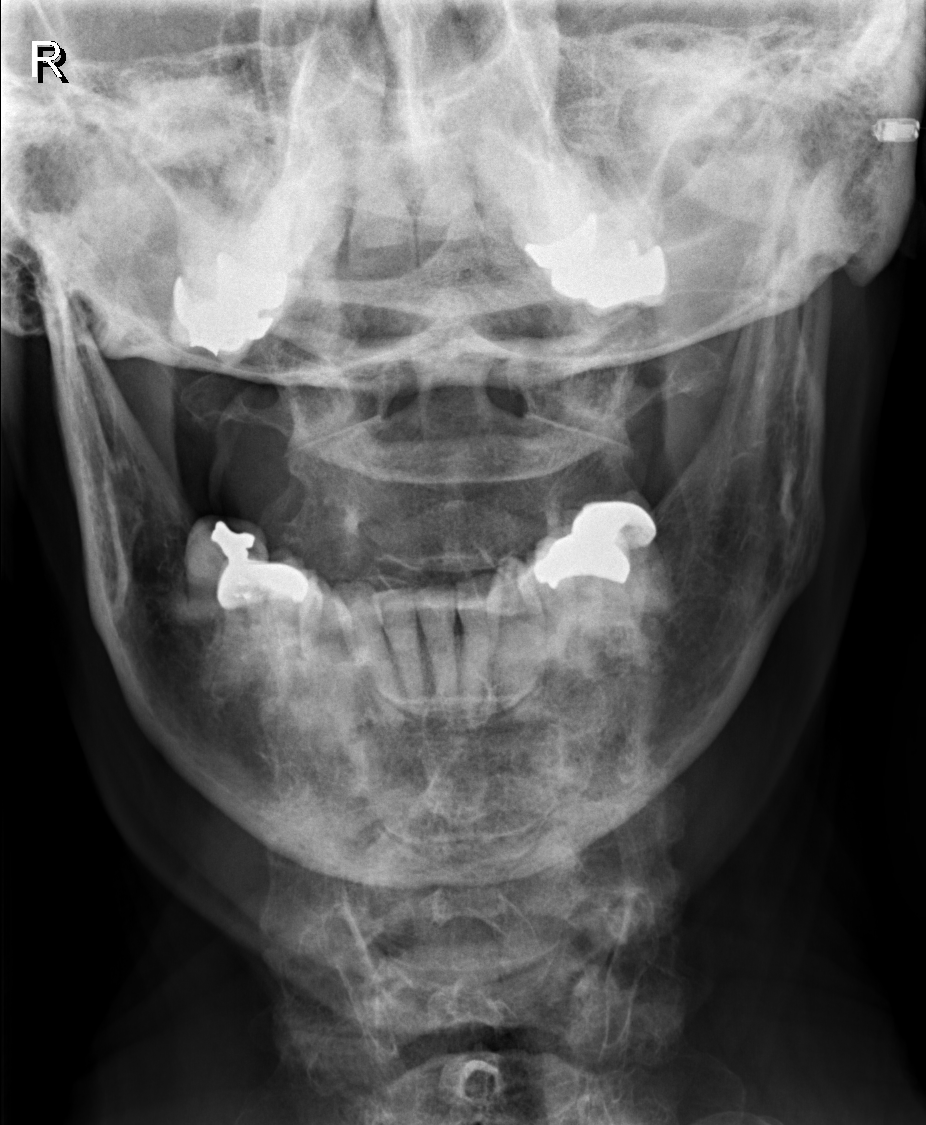

[3 of 3 positions shown; findings below may reference images not displayed]

FINDINGS: Frontal, lateral, and open-mouth odontoid images were obtained.
There is no fracture or spondylolisthesis. Prevertebral soft tissues
and predental space regions are normal. There is moderate disc space
narrowing at C6-7 and C7-T1. There is no erosive change. Lung apices
are clear.
IMPRESSION: Lower cervical region osteoarthritic change. No fracture or
spondylolisthesis.

## 2016-12-21 IMAGING — CR DG KNEE AP/LAT W/ SUNRISE*L*
1 series · 1 of 1 positions shown · non-contrast
Comparison: None.

CLINICAL DATA: Chronic left knee pain.  No known injury.

EXAM:
LEFT KNEE 3 VIEWS

[view not recorded]
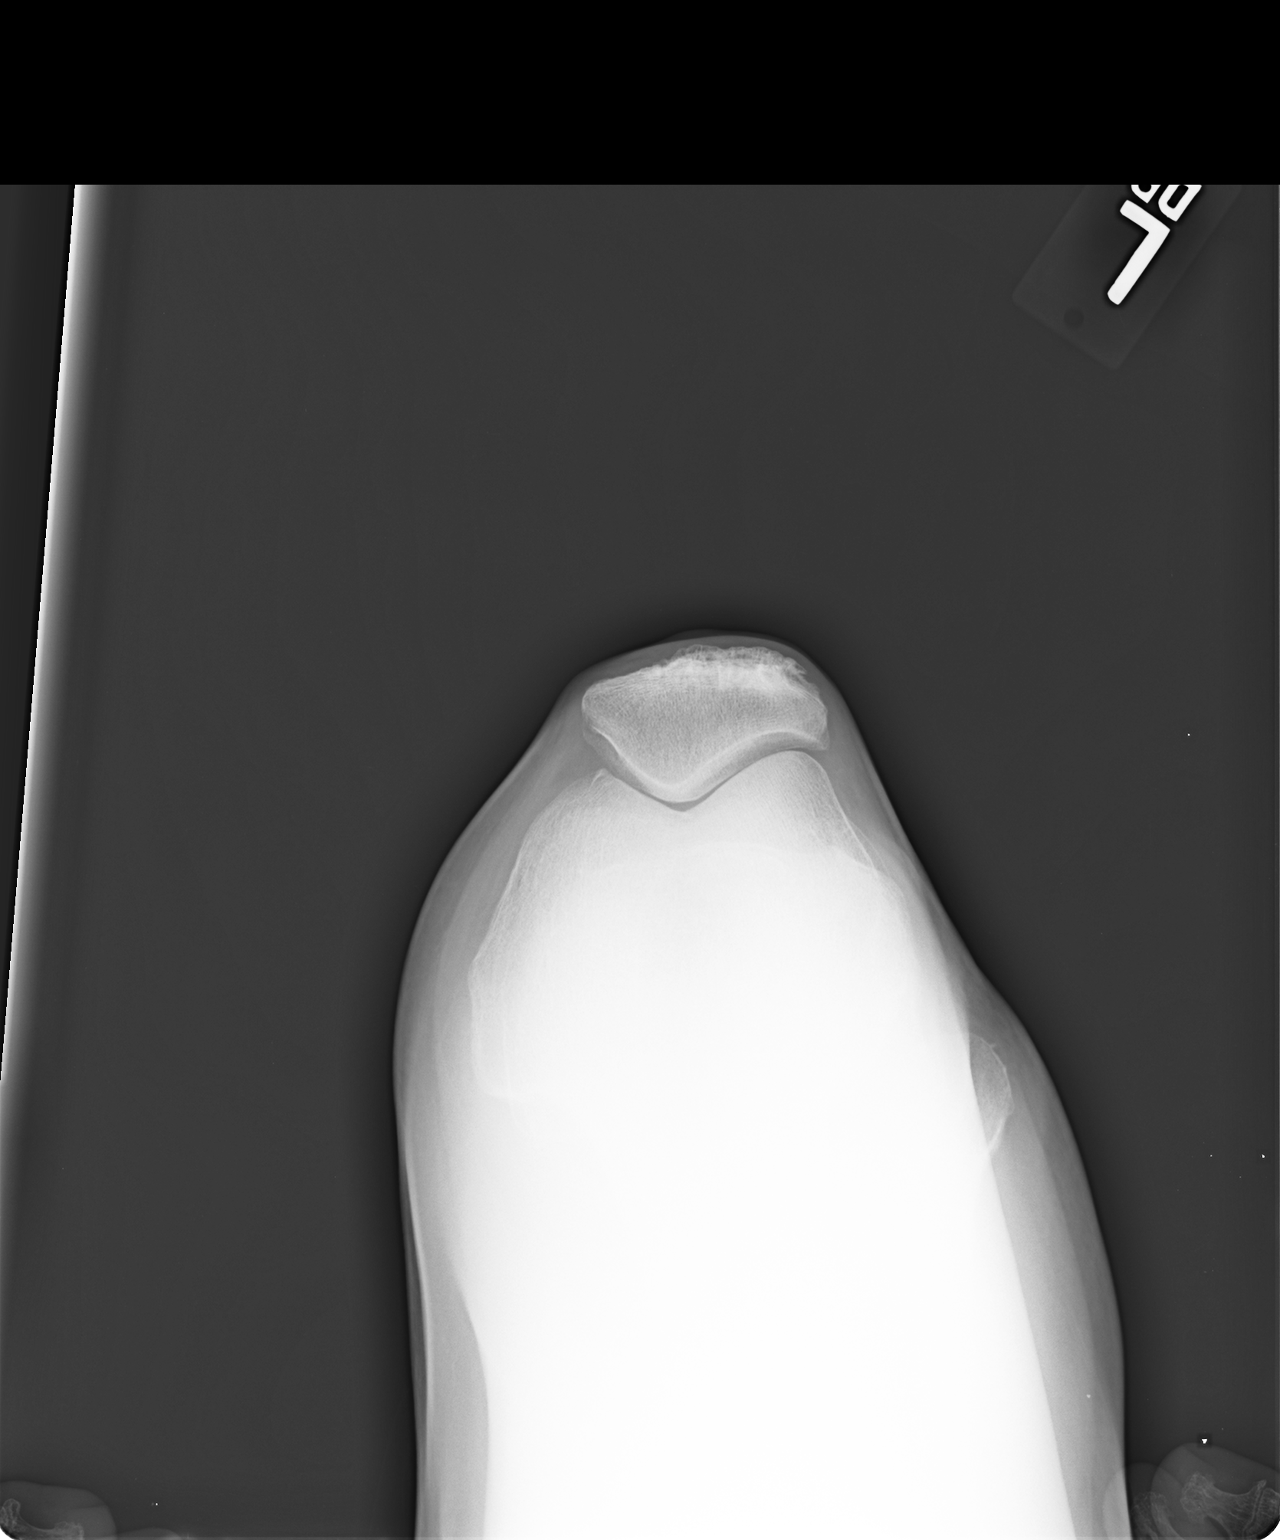

[1 of 1 positions shown; findings below may reference images not displayed]

FINDINGS: Moderate-sized anterior patellar spur at the site quadriceps
tendinous insertion. Otherwise, normal appearing bones and soft
tissues. No effusion.
IMPRESSION: No acute abnormality.

## 2017-03-17 DIAGNOSIS — Z85828 Personal history of other malignant neoplasm of skin: Secondary | ICD-10-CM | POA: Diagnosis not present

## 2017-03-17 DIAGNOSIS — D2239 Melanocytic nevi of other parts of face: Secondary | ICD-10-CM | POA: Diagnosis not present

## 2017-03-17 DIAGNOSIS — D225 Melanocytic nevi of trunk: Secondary | ICD-10-CM | POA: Diagnosis not present

## 2017-03-17 DIAGNOSIS — D1801 Hemangioma of skin and subcutaneous tissue: Secondary | ICD-10-CM | POA: Diagnosis not present

## 2017-03-17 DIAGNOSIS — L821 Other seborrheic keratosis: Secondary | ICD-10-CM | POA: Diagnosis not present

## 2017-03-17 DIAGNOSIS — L812 Freckles: Secondary | ICD-10-CM | POA: Diagnosis not present

## 2017-03-17 DIAGNOSIS — L720 Epidermal cyst: Secondary | ICD-10-CM | POA: Diagnosis not present

## 2017-06-08 DIAGNOSIS — Z01419 Encounter for gynecological examination (general) (routine) without abnormal findings: Secondary | ICD-10-CM | POA: Diagnosis not present

## 2017-06-08 DIAGNOSIS — Z1231 Encounter for screening mammogram for malignant neoplasm of breast: Secondary | ICD-10-CM | POA: Diagnosis not present

## 2017-06-08 DIAGNOSIS — N958 Other specified menopausal and perimenopausal disorders: Secondary | ICD-10-CM | POA: Diagnosis not present

## 2017-06-08 DIAGNOSIS — Z6821 Body mass index (BMI) 21.0-21.9, adult: Secondary | ICD-10-CM | POA: Diagnosis not present

## 2017-06-08 DIAGNOSIS — M816 Localized osteoporosis [Lequesne]: Secondary | ICD-10-CM | POA: Diagnosis not present

## 2017-07-09 DIAGNOSIS — Z23 Encounter for immunization: Secondary | ICD-10-CM | POA: Diagnosis not present

## 2017-07-27 DIAGNOSIS — H26491 Other secondary cataract, right eye: Secondary | ICD-10-CM | POA: Diagnosis not present

## 2017-07-27 DIAGNOSIS — H524 Presbyopia: Secondary | ICD-10-CM | POA: Diagnosis not present

## 2017-07-27 DIAGNOSIS — D3132 Benign neoplasm of left choroid: Secondary | ICD-10-CM | POA: Diagnosis not present

## 2017-07-27 DIAGNOSIS — H43811 Vitreous degeneration, right eye: Secondary | ICD-10-CM | POA: Diagnosis not present

## 2017-07-28 DIAGNOSIS — H26491 Other secondary cataract, right eye: Secondary | ICD-10-CM | POA: Diagnosis not present

## 2017-08-05 DIAGNOSIS — H5711 Ocular pain, right eye: Secondary | ICD-10-CM | POA: Diagnosis not present

## 2017-08-05 DIAGNOSIS — S0501XA Injury of conjunctiva and corneal abrasion without foreign body, right eye, initial encounter: Secondary | ICD-10-CM | POA: Diagnosis not present

## 2017-09-20 ENCOUNTER — Ambulatory Visit (INDEPENDENT_AMBULATORY_CARE_PROVIDER_SITE_OTHER): Payer: Medicare Other | Admitting: Sports Medicine

## 2017-09-20 ENCOUNTER — Ambulatory Visit
Admission: RE | Admit: 2017-09-20 | Discharge: 2017-09-20 | Disposition: A | Discharge status: Home / Self Care | Payer: Medicare Other | Source: Ambulatory Visit | Attending: Sports Medicine | Admitting: Sports Medicine

## 2017-09-20 DIAGNOSIS — M25511 Pain in right shoulder: Principal | ICD-10-CM

## 2017-09-20 DIAGNOSIS — M19011 Primary osteoarthritis, right shoulder: Secondary | ICD-10-CM | POA: Diagnosis not present

## 2017-09-20 DIAGNOSIS — G8929 Other chronic pain: Secondary | ICD-10-CM

## 2017-09-20 NOTE — Assessment & Plan Note (Signed)
Likely 2/2 rotator cuff tear, given pain and weakness with both external rotation and empty can. Proximal biceps tendon rupture possibly also contributing to pain. XR today to assess for OA, however feel that this is less likely cause. Return tomorrow for shoulder [place] to assess for rotator cuff and biceps tendon tears with possible injection if indicated.

## 2017-09-20 NOTE — Progress Notes (Signed)
   Subjective:    Patient ID: Beth Clark, female    DOB: 1951-03-10, 67 y.o.   MRN: [id number]  HPI  Beth Clark is a 67yo F presenting with R shoulder pain.   This is not a new issue, and has been present intermittently since she was first seen for this issue at sports med clinic in 2016, however it has been significantly worsening over the past 2 weeks. Prior to onset of pain, she was doing chest flies at the gym. Since then, has not exercised at all, however pain has not improved. Typically pain improves with activity modification, but that has not been the case over the past two weeks. Has been taking Aleve and meloxicam. Says this improves pain somewhat but pain has not completely resolved. Has also tried ice which did not improve pain. Pain is worse some days than others. Pain awakens her at night if she sleeps on the R arm/shoulder. Says that pain is mostly located over her R biceps, but she also has generalized R shoulder pain at times. Endorses trembling when lifting things overhead (such as when putting away dishes), and noticeable weakness in the R arm/shoulder. Also has worsening pain when reaching back, such as when putting on a coat.   Review of Systems MSK: Denies numbness, tingling, redness, swelling of RUE. Endorses weakness of RUE.     Objective:   Physical Exam  Constitutional: She is oriented to person, place, and time. She appears well-developed and well-nourished. No distress.  Musculoskeletal:  RUE: 4/5 strength with external rotation. Pos empty can. Full active ROM.  TTP over biceps tendon; Popeye deformity present.   Neurological: She is alert and oriented to person, place, and time.  Psychiatric: She has a normal mood and affect. Her behavior is normal.      Assessment & Plan:  Right shoulder pain Likely 2/2 rotator cuff tear, given pain and weakness with both external rotation and empty can. Proximal biceps tendon rupture possibly also contributing to pain. XR  today to assess for OA, however feel that this is less likely cause. Return tomorrow for shoulder [place] to assess for rotator cuff and biceps tendon tears with possible injection if indicated.   Adin Hector, MD, MPH PGY-3 [facility] Pager [phone]  Patient seen and evaluated with the resident. I agree with the above plan of care. Patient has weakness with both resisted supraspinatus and external rotation. X-rays show mild degenerative changes but not severe. Patient will return to the office tomorrow for an MSK ultrasound to evaluate her rotator cuff. She also has a Popeye deformity consistent with a proximal biceps tendon rupture but that is minimally symptomatic at this time.

## 2017-09-21 ENCOUNTER — Encounter: Payer: Self-pay | Admitting: Sports Medicine

## 2017-09-21 ENCOUNTER — Encounter: Payer: Self-pay | Admitting: Family Medicine

## 2017-09-21 ENCOUNTER — Ambulatory Visit: Payer: Self-pay

## 2017-09-21 ENCOUNTER — Ambulatory Visit (INDEPENDENT_AMBULATORY_CARE_PROVIDER_SITE_OTHER): Payer: Medicare Other | Admitting: Family Medicine

## 2017-09-21 VITALS — BP 118/82 | Ht 65.0 in | Wt 124.0 lb

## 2017-09-21 DIAGNOSIS — M25511 Pain in right shoulder: Principal | ICD-10-CM

## 2017-09-21 DIAGNOSIS — G8929 Other chronic pain: Secondary | ICD-10-CM

## 2017-09-21 MED ORDER — NITROGLYCERIN 0.2 MG/HR TD PT24: MEDICATED_PATCH | TRANSDERMAL | 0 refills | Status: DC

## 2017-09-21 NOTE — Patient Instructions (Signed)

## 2017-09-21 NOTE — Progress Notes (Signed)
Chief complaint: Acute on chronic right shoulder pain x 2 weeks  History of present illness: Beth Clark is a 67 year old right-hand-dominant female who presents to the sports medicine office today for ultrasound of her right shoulder.  She was seen yesterday here in the office by Dr. Micheline Chapman for acute on chronic right shoulder pain.  Symptoms have been present for approximately 2 weeks.  Please see office note from yesterday for further details in regards to her history.  She was found to have more of a past deformity in her right arm.  Dr. Micheline Chapman was concerned for rotator cuff tear, specifically the supraspinatus, infraspinatus, and biceps tendon.  He did get an x-ray that was done yesterday, showed some mild AC joint osteoarthritis. He requested to have her come in today to have ultrasound done for more diagnostic evaluation of her right rotator cuff tendons.   Review of systems:  As stated above  Interval past medical history, surgical history, family history, and social history obtained and unchanged.  Physical exam: Vital signs are reviewed and are documented in the chart Gen.: Alert, oriented, appears stated age, in no apparent distress HEENT: Moist oral mucosa Respiratory: Normal respirations, able to speak in full sentences Cardiac: Regular rate, distal pulses 2+ Integumentary: No rashes on visible skin:  Neurologic: She is slightly weak with abduction and external rotation on the right side, would categorize strength as 4+/5, otherwise strength 5/5, sensation 2+ bilateral upper extremities Psych: Normal affect, mood is described as good Musculoskeletal: Inspection of right shoulder reveals no obvious deformity or muscle atrophy, no warmth, erythema, ecchymosis, or effusion, she is slightly tender to palpation over the distal supraspinatus tendon insertion on the humeral head, she is also tender to palpation over the short head of the biceps proximally, she does have full shoulder range of  motion, rotator cuff impingement testing is positive with empty cans, Hawkins, cross arm  Musculoskeletal ultrasound was performed on her right shoulder today: -She does have elliptical circumferential hypoechoic changes seen along the proximal biceps tendon, does have partial tearing of the proximal portion of the biceps tendon, but still does have a few intact biceps tendon fibers -Unremarkable subscapularis tendon -She does have more of an atrophied supraspinatus tendon, she does have some hypoechoic changes and cortical irregularity noted along the articular side of the supraspinatus indicative of partial tearing of the supraspinatus tendon, do also see slight hypoechoic changes along the bursal side of the supraspinatus -No mushroom sign noted at [facility] joint, does have slight decrease in joint space consistent with mild AC joint osteoarthritis -She does have partial tearing noted at the infraspinatus tendon insertion on humeral head -Unremarkable teres minor tendon  Impression: Partial tearing involving more than 50% of proximal biceps tendon, partial, degenerative articular-sided supraspinatus tear, atrophied supraspinatus tendon, and partial tearing of the infraspinatus tendon   Ultrasound done and interpreted by Mort Sawyers, MD  Assessment and plan: 1. Acute on chronic right shoulder pain, with ultrasound evidence of partial biceps, supraspinatus, and infraspinatus tendon tears  Plan: Reviewed ultrasound results with Beth Clark in the office today. Did bring in Dr. Micheline Chapman to review the results with Beth Clark and myself. Will go ahead and have Beth Clark started on nitroglycerin patch protocol. No contraindications. Discussed side effect of headache. Discussed to cut patch into quarter-size pieces. Discussed to keep on for 24 hours and change daily, rotating area of placement to minimize risk of rash. Gave her option to have cortisone injection today if she wants quicker relief. She would  like to hold  off on this today, which I do feel is very reasonable. Will keep this in back pocket in the event symptoms get worse. Did provide her with HEP for strengthening of her rotator cuff muscles. Will plan to see her back in 4-6 weeks with Dr. Micheline Chapman or sooner as needed.   Mort Sawyers, M.D. [facility]

## 2017-10-19 ENCOUNTER — Encounter: Payer: Self-pay | Admitting: Family Medicine

## 2017-10-19 ENCOUNTER — Ambulatory Visit (INDEPENDENT_AMBULATORY_CARE_PROVIDER_SITE_OTHER): Payer: Medicare Other | Admitting: Family Medicine

## 2017-10-19 VITALS — BP 151/68 | Ht 65.0 in | Wt 124.0 lb

## 2017-10-19 DIAGNOSIS — G8929 Other chronic pain: Secondary | ICD-10-CM | POA: Diagnosis not present

## 2017-10-19 DIAGNOSIS — M25511 Pain in right shoulder: Secondary | ICD-10-CM

## 2017-10-19 NOTE — Progress Notes (Signed)
Chief complaint: Follow up of right shoulder pain x 6 weeks  History of present illness: Beth Clark is a 67 year old right-hand-dominant female who presents to the sports medicine office today for follow-up of right shoulder pain.  Symptoms have been present for approximately 6 weeks.  She was seen by me 4 weeks ago for ultrasound of her right shoulder.  The day previously, she saw Dr. Micheline Chapman for initial presentation of right shoulder pain.  The concern at that time was rotator cuff tendinopathy and partial tearing.  Ultrasound was done, did show partial tearing involving more than 50% of the proximal biceps tendon. It also showed partial degenerative articular-sided supraspinatus tear, atrophy of the supraspinatus tendon, and partial tearing of the infraspinatus tendon.  Nitroglycerin therapy was started.  She reports that she has noted interval improvement in her symptoms.  She reports feeling about 25% better today.  She reports of no adverse effects of the nitroglycerin patch such as headache.  She reports that her husband recently had knee replacement, has not been consistent to do physical therapy exercises secondary to tending to her husband.  She reports main aggravating factors today including shoulder extension and forward reaching.  She does not report of any interval injury or trauma.  No numbness, tingling, or burning paresthesias.   Review of systems:  As stated above  Interval past medical history, surgical history, family history, and social history obtained and unchanged.  History notable for anxiety, palpitations, and panic attacks, she has not had any procedures/hospitalizations, does not report of any current tobacco use, family history noncontributory to orthopedic complaint.  Use medications reviewed, are reflected in EMR.  Physical exam: Vital signs are reviewed and are documented in the chart Gen.: Alert, oriented, appears stated age, in no apparent distress HEENT: Moist oral  mucosa Respiratory: Normal respirations, able to speak in full sentences Cardiac: Regular rate, distal pulses 2+ Integumentary: No rashes on visible skin:  Neurologic: She continues to have slight weakness with rotator cuff strength involving the supraspinatus and infraspinatus muscles, would categorize strength as 4+/5, otherwise strength 5/5, sensation 2+ in bilateral upper extremities Psych: Normal affect, mood is described as good Musculoskeletal: Inspection of right shoulder reveals no obvious deformity or muscle atrophy, no warmth, erythema, ecchymosis, or effusion, she is slightly tender to deep palpation over the lateral deltoid and the proximal short head of biceps, as well as distal supraspinatus tendon insertion on the humeral head, she does have full shoulder range of motion, rotator cuff impingement testing is positive with empty can and Hawkins, cross arm is also positive, no pain with Speed and Yergason today  Complete musculoskeletal ultrasound was performed on her right shoulder today: -She continues to have elliptical circumferential hypoechoic changes seen along the proximal biceps tendon, does have partial tearing of the proximal portion of the biceps tendon, but still does have a few intact biceps tendon fibers, neovascularization seen today in the midbody of the biceps -She does have some slight articular-sided and midbody hypoechoic changes seen today in the subscapularis tendon, suggestive of subscapularis tendinopathy -She continues to have some hypoechoic changes and cortical irregularity noted along the articular side of the supraspinatus indicative of partial tearing of the supraspinatus tendon, do also see slight hypoechoic changes along the bursal side of the supraspinatus, neovascularization more seen on the bursal side of the tendon -Mushroom sign noted at [facility] joint, does have slight decrease in joint space consistent with mild AC joint osteoarthritis -She does have  partial tearing noted at  the infraspinatus tendon insertion on humeral head as well as some that was captured in the midbody with neovascularization seen -Unremarkable teres minor tendon  Impression: Continued partial tearing involving more than 50% of proximal biceps tendon, partial, degenerative articular-sided supraspinatus tear, atrophied supraspinatus tendon, partial tearing of the infraspinatus tendon, with new subscapularis tendinopathy changes with evidence of interval neovascularization seen  Ultrasound done and interpreted byChristopher Izek Corvino, MD  Assessment and plan: 1. Acute on chronic right shoulder pain, with ultrasound evidence of partial biceps, supraspinatus, and infraspinatus tendon tears  Plan: Discussed and reviewed ultrasound with Beth Clark today.  She does have interval changes of neovascularization seen, no evidence of any significant ultrasound evidence of healing today.  I discussed that it has only been 4 weeks since the most recent ultrasound, so it is unlikely to see much of a change.  I discussed to continue with one quarter patch of nitroglycerin daily.  She reports that it had been discussed with Dr. Micheline Chapman that she could potentially increase to using two quarter size patches daily.  I discussed for now let's just keep it to one and see how she does, since I do see evidence of neovascularization.  Discussed continue with home exercise program.  Will have her follow-up in 6 weeks or sooner as needed.   Mort Sawyers, M.D. [facility]

## 2017-11-23 ENCOUNTER — Ambulatory Visit (INDEPENDENT_AMBULATORY_CARE_PROVIDER_SITE_OTHER): Payer: Medicare Other | Admitting: Family Medicine

## 2017-11-23 ENCOUNTER — Encounter: Payer: Self-pay | Admitting: Family Medicine

## 2017-11-23 VITALS — BP 122/72 | Ht 65.0 in | Wt 124.0 lb

## 2017-11-23 DIAGNOSIS — M67911 Unspecified disorder of synovium and tendon, right shoulder: Secondary | ICD-10-CM

## 2017-11-23 NOTE — Progress Notes (Signed)
Chief complaint: Follow-up of right shoulder pain x 10 weeks  History of present illness: Beth Clark is a 67 year old right-hand-dominant female who presents to sports medicine office today for follow-up of right shoulder pain.  Symptoms now been present for approximately 10 weeks.  She was seen by me 4 weeks ago for ultrasound evaluation of right shoulder pain.  The concern at that time was rotator cuff tendinopathy and partial tearing.  Ultrasound did show partial tearing involving more than 50% of the proximal biceps tendon, also showed partial degenerative articular sided supraspinatus tear, atrophy of the supraspinatus tendon, and partial tearing of the infraspinatus tendon.  Nitroglycerin therapy was started back at initial time of presentation.  She does report of interval improvement in her symptoms.  She reports pain today is a 1/10.  She reports noticing pain the most at nighttime when she is specifically trying to lay on the right side.  She reports any type of forward flexion with slight arm adduction is an aggravating factor.  She points in the area of the biceps tendon as to where she feels the pain.  She reports occasionally she will feel pain along the supraspinatus tendon insertion on the humeral head laterally.  She reports that she has been very diligent with physical therapy exercises at home.  She is also been doing gym exercises pretty regularly a few times a week.  She has been using nitroglycerin patch on daily basis.  She does inquire today regarding use of lidocaine patch as she has used an old prescription of lidocaine 5% patch for shoulder which she does find improvement with.  She reports that she takes the nitroglycerin patch off and places the lidocaine patch on in it's place.  She does not report of any interval injury or trauma.  No numbness, tingling, or burning paresthesias radiating down her right arm into her right hand and fingers.  She reports only having to take meloxicam very  intermittently.  Review of systems:  As stated above  Interval past medical history, surgical history, family history, and social history obtained and unchanged. Her past medical history notable for anxiety, palpitations, and panic attacks; she has not had any procedures/hospitalizations; she does not report of any current tobacco use; family history negative for type II diabetes.  Allergies and medications are reviewed, are reflected in EMR.   Physical exam: Vital signs are reviewed and are documented in the chart Gen.: Alert, oriented, appears stated age, in no apparent distress HEENT: Moist oral mucosa Respiratory: Normal respirations, able to speak in full sentences Cardiac: Regular rate, distal pulses 2+ Integumentary: No rashes on visible skin:  Neurologic: Strength 5/5, sensation 2+ Psych: Normal affect, mood is described as good Musculoskeletal: Inspection of right shoulder reveals no obvious deformity or muscle atrophy, no warmth, erythema, ecchymosis, or effusion, she is slightly tender to deep palpation over the distal supraspinatus tendon insertion on the humeral head and lateral deltoid, slight tenderness noted anteriorly along the long head of the biceps, she does have full shoulder range of motion today, with right arm in 90 degrees of forward flexion, moving her right arm about 10-15 deg in adduction does cause discomfort along the long head of the biceps, rotator cuff impingement testing is negative today, no pain with empty can and Hawkins, Cross arm and O'Brien are both positive, no pain with Speed and Yergason today   Assessment and plan: 1. Right shoulder pain, with previous ultrasound evidence of partial biceps, supraspinatus, and infraspinatus tendon tears  Plan: It sounds as though Beth Clark has been doing well with her home exercise program.  It seems most of her pain today is coming from the biceps tendinopathy.  Did provide her with handout with specific bicep  strengthening exercises to focus on, while still maintaining with rotator cuff exercises.  Discussed that she can use over-the-counter lidocaine 4% patch since she is having success with this at nighttime.  Discussed the take off the nitroglycerin patch and place the lidocaine patch in its place.  Discussed to continue with the lidocaine patch for the next 6 weeks.  Would like to see her in the office in 6 weeks for follow-up.  It seems as though we can avoid advanced imaging such as MRI at this time as she is getting better.  If she plateaus or worsens, would consider MRI for further evaluation.  Otherwise, we will see her sooner as needed.   Mort Sawyers, M.D. [facility]

## 2017-12-15 DIAGNOSIS — Z0000 Encounter for general adult medical examination without abnormal findings: Secondary | ICD-10-CM | POA: Diagnosis not present

## 2017-12-15 DIAGNOSIS — Z5181 Encounter for therapeutic drug level monitoring: Secondary | ICD-10-CM | POA: Diagnosis not present

## 2017-12-15 DIAGNOSIS — M545 Low back pain: Secondary | ICD-10-CM | POA: Diagnosis not present

## 2017-12-15 DIAGNOSIS — E7800 Pure hypercholesterolemia, unspecified: Secondary | ICD-10-CM | POA: Diagnosis not present

## 2017-12-15 DIAGNOSIS — M810 Age-related osteoporosis without current pathological fracture: Secondary | ICD-10-CM | POA: Diagnosis not present

## 2017-12-15 DIAGNOSIS — G4700 Insomnia, unspecified: Secondary | ICD-10-CM | POA: Diagnosis not present

## 2017-12-15 DIAGNOSIS — Z1389 Encounter for screening for other disorder: Secondary | ICD-10-CM | POA: Diagnosis not present

## 2017-12-15 DIAGNOSIS — E559 Vitamin D deficiency, unspecified: Secondary | ICD-10-CM | POA: Diagnosis not present

## 2017-12-15 DIAGNOSIS — F4321 Adjustment disorder with depressed mood: Secondary | ICD-10-CM | POA: Diagnosis not present

## 2018-01-11 ENCOUNTER — Encounter: Payer: Self-pay | Admitting: Family Medicine

## 2018-01-11 ENCOUNTER — Ambulatory Visit (INDEPENDENT_AMBULATORY_CARE_PROVIDER_SITE_OTHER): Payer: Medicare Other | Admitting: Family Medicine

## 2018-01-11 VITALS — BP 114/70 | Ht 65.0 in | Wt 124.0 lb

## 2018-01-11 DIAGNOSIS — G8929 Other chronic pain: Secondary | ICD-10-CM

## 2018-01-11 DIAGNOSIS — M25511 Pain in right shoulder: Secondary | ICD-10-CM | POA: Diagnosis not present

## 2018-01-11 MED ORDER — NITROGLYCERIN 0.2 MG/HR TD PT24: MEDICATED_PATCH | TRANSDERMAL | 0 refills | Status: AC

## 2018-01-11 NOTE — Progress Notes (Signed)
Chief complaint: Follow-up of right shoulder pain x16 weeks  History of present illness: Beth Clark is a 67 year old right-hand-dominant female who presents to sports medicine office today for follow-up of right shoulder pain.  Symptoms have been present for approximately 16 weeks now.  She does have known history of ultrasound proven partial biceps, supraspinatus, and infraspinatus tendinopathy and partial tendon tearing.  She has been on nitroglycerin protocol for approximately 12 weeks now.  She reports no issues in regards to the nitroglycerin patches, such as rashes or headaches.  Since last office visit she has made interval gains, specifically with bicep strengthening exercises to focus on.  She reports still having pain with extremes of abduction and external range of motion.  She also notices a very minimal amount of pain when laying on her right side at nighttime.  She reports the worst pain is 1/10.  She does not report of any interval injury or trauma.  She does not report of any numbness, tingling, burning paresthesias.  She does not report of any weakness with grip strength.  Review of systems:  As stated above  Interval past medical history, surgical history, family history, and social history obtained and unchanged. Her past medical history notable for anxiety, palpitations, and panic attacks; she has not had any procedures/hospitalizations; she does not report of any current tobacco use; family history negative for type II diabetes. Allergies and medications are reviewed, are reflected in EMR.   Physical exam: Vital signs are reviewed and are documented in the chart Gen.: Alert, oriented, appears stated age, in no apparent distress HEENT: Moist oral mucosa Respiratory: Normal respirations, able to speak in full sentences Cardiac: Regular rate, distal pulses 2+ Integumentary: No rashes on visible skin:  Neurologic: She does have intact rotator cuff strength on both sides, would  categorize strength as  5/5, sensation 2+ in bilateral upper extremities Psych: Normal affect, mood is described as good Musculoskeletal: Inspection of her right shoulder reveals no obvious deformity or muscle atrophy, no warmth, erythema, ecchymosis, or effusion, she is not tender to palpation anywhere in the right shoulder today including the distal clavicle, AC joint, acromion, scapular spine, paracervical spinal processes, cervical spine, trapezius, rhomboids, deltoids, triceps, and biceps, she does have full shoulder range of motion but does report of having pain near the last 10 degrees of external range of motion with her right arm adducted to her side, she still has pain today with rotator cuff impingement testing, specifically with empty cans, Luan Pulling, crossarm, and O'Brien, Speed, Yergason negative today  Assessment and plan: 1. Right shoulder pain, with previous ultrasound evidence of partial biceps, supraspinatus, and infraspinatus tendon tears  Plan: Discussed with [name] today that she is making gains in regards to continued strengthening.  Discussed to focus on not just rotator cuff strengthening but secondary, dynamic rotator cuff stabilizers such as the deltoids, trapezius, and triceps, in addition continue with the biceps.  Will have her continue with the nitroglycerin protocol, will not make any changes at this time. Refill of patches sent in.  Discussed more aggressive options to include corticosteroid injection into the right subacromial bursa.  She feels that is not painful enough to warrant it at this time.  She also does not want any further advanced imaging work-up at this time as she feels she is making gains.  I do agree with her on this and will continue with home exercise program.  Will plan to see her back in about 4 weeks for follow-up or sooner  as needed.   Mort Sawyers, M.D. [facility]

## 2018-02-02 DIAGNOSIS — H43811 Vitreous degeneration, right eye: Secondary | ICD-10-CM | POA: Diagnosis not present

## 2018-02-02 DIAGNOSIS — Z961 Presence of intraocular lens: Secondary | ICD-10-CM | POA: Diagnosis not present

## 2018-02-02 DIAGNOSIS — H25812 Combined forms of age-related cataract, left eye: Secondary | ICD-10-CM | POA: Diagnosis not present

## 2018-02-08 ENCOUNTER — Ambulatory Visit: Payer: Medicare Other | Admitting: Family Medicine

## 2018-02-10 ENCOUNTER — Encounter: Payer: Self-pay | Admitting: Family Medicine

## 2018-02-10 ENCOUNTER — Ambulatory Visit (INDEPENDENT_AMBULATORY_CARE_PROVIDER_SITE_OTHER): Payer: Medicare Other | Admitting: Family Medicine

## 2018-02-10 VITALS — BP 120/76 | Ht 65.0 in | Wt 124.0 lb

## 2018-02-10 DIAGNOSIS — M170 Bilateral primary osteoarthritis of knee: Secondary | ICD-10-CM | POA: Diagnosis not present

## 2018-02-10 DIAGNOSIS — M67911 Unspecified disorder of synovium and tendon, right shoulder: Secondary | ICD-10-CM | POA: Diagnosis present

## 2018-02-10 MED ORDER — DICLOFENAC SODIUM 75 MG PO TBEC: DELAYED_RELEASE_TABLET | ORAL | 0 refills | Status: DC

## 2018-02-10 NOTE — Progress Notes (Signed)
Chief complaint: Follow-up of chronic right shoulder pain x20 weeks  History of present illness: Beth Clark is a 67 year old right-hand-dominant female who presents to sports medicine office today for follow-up of chronic right shoulder pain.  Symptoms have been present for approximately 20 weeks.  She does have known history of ultrasound proven partial biceps, supraspinatus, infraspinatus tendinopathy, as well as partial tearing in the supraspinatus.  She has been on the nitroglycerin protocol for approximately 15 weeks now.  She reports that she stopped using the nitroglycerin last week, has not noticed any changes in her symptoms.  She is still very active and doing gym exercises as well as home exercises with [place] exercises.  She does not report of any interval injury or trauma.  She reports on occasion she will feel a throbbing, nagging, and occasionally sharp pain more in the lateral aspect of the right upper arm in the deltoid musculature.  She does not report of any shoulder pain today otherwise.  She reports mainly feeling this deltoid pain with forward flexion, not so much with abduction.  She does not report of any numbness, tingling, burning paresthesias.  She has been using occasional meloxicam, as her left thumb in both of her knees are starting to bother her a little bit.  She reports that she did do some more spin workouts.  She does have known history of patellofemoral osteoarthritis as seen on x-ray back in January 2017.  Review of systems:  As stated above  Interval past medical history, surgical history, family history, and social history obtained and unchanged.Her past medical history notable for anxiety, palpitations, and panic attacks;she has not had any procedures/hospitalizations; shedoes not report of any current tobacco use;family historynegative for type II diabetes.Allergies andmedicationsarereviewed, are reflected in EMR.   Physical exam: Vital signs are reviewed  and are documented in the chart Gen.: Alert, oriented, appears stated age, in no apparent distress HEENT: Moist oral mucosa Respiratory: Normal respirations, able to speak in full sentences Cardiac: Regular rate, distal pulses 2+ Integumentary: No rashes on visible skin:  Neurologic: She does have intact rotator cuff strength on both sides, would categorize strength as  5/5, sensation 2+ in bilateral upper extremities Psych: Normal affect, mood is described as good Musculoskeletal: Inspection of her right shoulder reveals no obvious deformity or muscle atrophy, no warmth, erythema, ecchymosis, or effusion, she is tender to deep palpation over the deltoid musculature on the right side, no tenderness otherwise in the right shoulder including the distal clavicle, AC joint, acromion, scapular spine, paracervical spinal processes, cervical spine, trapezius, rhomboids, biceps, or triceps, she does have full shoulder range of motion and strength, reports feeling slight pain in the deltoid musculature when she gets to near 90 degrees of forward flexion, rotator cuff impingement testing is negative today; inspection of both her knees reveal no obvious deformity or muscle atrophy, no warmth, erythema, ecchymosis, or effusion, she is tender to palpation more in the parapatellar region more on the superior aspect of the patella on both sides, no signs of ligamentous instability is Lachman, anterior drawer, valgus, varus stress testing negative, McMurray negative, she does have full range of motion going from 0 degrees to 145 degrees on both sides, strength intact with bilateral knee flexion and knee extension, patellar apprehension test negative  Assessment and plan: 1.  Chronic right shoulder pain, suspect secondary to rotator cuff tendinopathy involving the supraspinatus, infraspinatus, biceps musculature, doing better today 2.  Bilateral knee pain, suspect secondary to aggravation of pre-existing patellofemoral  osteoarthritis  Plan: Since she is still continuing to make gains, discussed with Dawnya today that she can continue to not use the nitroglycerin and just continue with gym exercises, home exercise program and see how she does with this.  Discussed importance of working on deltoid, scapular, and other secondary stabilizers of the rotator cuff.  In regards to bilateral knee pain, sounds like symptoms are as suggestive of aggravation of pre-existing patellofemoral osteoarthritis.  Question whether spin exercises are aggravating things, as well as doing deep lunging and squatting.  Discussed avoidance of these activities for now.  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  Discussed that these continue to be problematic for her to return office for reevaluation.  Otherwise, we will keep things open ended and have her return on as-needed basis.   Mort Sawyers, M.D. [facility]

## 2018-02-14 ENCOUNTER — Other Ambulatory Visit: Payer: Self-pay

## 2018-02-14 MED ORDER — DICLOFENAC SODIUM 1 % TD GEL: 2.0000 g | Freq: Three times a day (TID) | TRANSDERMAL | 0 refills | Status: AC | PRN

## 2018-03-21 DIAGNOSIS — Z85828 Personal history of other malignant neoplasm of skin: Secondary | ICD-10-CM | POA: Diagnosis not present

## 2018-03-21 DIAGNOSIS — L821 Other seborrheic keratosis: Secondary | ICD-10-CM | POA: Diagnosis not present

## 2018-03-21 DIAGNOSIS — D485 Neoplasm of uncertain behavior of skin: Secondary | ICD-10-CM | POA: Diagnosis not present

## 2018-03-21 DIAGNOSIS — D225 Melanocytic nevi of trunk: Secondary | ICD-10-CM | POA: Diagnosis not present

## 2018-03-21 DIAGNOSIS — D1801 Hemangioma of skin and subcutaneous tissue: Secondary | ICD-10-CM | POA: Diagnosis not present

## 2018-03-29 DIAGNOSIS — H9041 Sensorineural hearing loss, unilateral, right ear, with unrestricted hearing on the contralateral side: Secondary | ICD-10-CM | POA: Diagnosis not present

## 2018-03-29 DIAGNOSIS — H903 Sensorineural hearing loss, bilateral: Secondary | ICD-10-CM | POA: Diagnosis not present

## 2018-03-29 DIAGNOSIS — H9311 Tinnitus, right ear: Secondary | ICD-10-CM | POA: Diagnosis not present

## 2018-06-09 DIAGNOSIS — M19042 Primary osteoarthritis, left hand: Secondary | ICD-10-CM | POA: Diagnosis not present

## 2018-06-09 DIAGNOSIS — M19041 Primary osteoarthritis, right hand: Secondary | ICD-10-CM | POA: Diagnosis not present

## 2018-06-13 DIAGNOSIS — M25541 Pain in joints of right hand: Secondary | ICD-10-CM | POA: Diagnosis not present

## 2018-06-13 DIAGNOSIS — M818 Other osteoporosis without current pathological fracture: Secondary | ICD-10-CM | POA: Diagnosis not present

## 2018-06-13 DIAGNOSIS — Z1231 Encounter for screening mammogram for malignant neoplasm of breast: Secondary | ICD-10-CM | POA: Diagnosis not present

## 2018-06-13 DIAGNOSIS — M19042 Primary osteoarthritis, left hand: Secondary | ICD-10-CM | POA: Diagnosis not present

## 2018-06-13 DIAGNOSIS — Z01419 Encounter for gynecological examination (general) (routine) without abnormal findings: Secondary | ICD-10-CM | POA: Diagnosis not present

## 2018-06-13 DIAGNOSIS — Z6821 Body mass index (BMI) 21.0-21.9, adult: Secondary | ICD-10-CM | POA: Diagnosis not present

## 2018-06-13 DIAGNOSIS — M25542 Pain in joints of left hand: Secondary | ICD-10-CM | POA: Diagnosis not present

## 2018-06-13 DIAGNOSIS — M19041 Primary osteoarthritis, right hand: Secondary | ICD-10-CM | POA: Diagnosis not present

## 2018-06-13 DIAGNOSIS — M25642 Stiffness of left hand, not elsewhere classified: Secondary | ICD-10-CM | POA: Diagnosis not present

## 2018-06-13 DIAGNOSIS — M25641 Stiffness of right hand, not elsewhere classified: Secondary | ICD-10-CM | POA: Diagnosis not present

## 2018-07-25 DIAGNOSIS — Z23 Encounter for immunization: Secondary | ICD-10-CM | POA: Diagnosis not present

## 2018-12-31 IMAGING — DX DG SHOULDER 2+V*R*
3 series · 3 of 3 positions shown · non-contrast
Comparison: None.

CLINICAL DATA: 66-year-old female with right shoulder pain for 3
years worsening over past 2 weeks. No injury. Initial encounter.

EXAM:
RIGHT SHOULDER - 2+ VIEW

[dg shoulder right (1 of 3)]
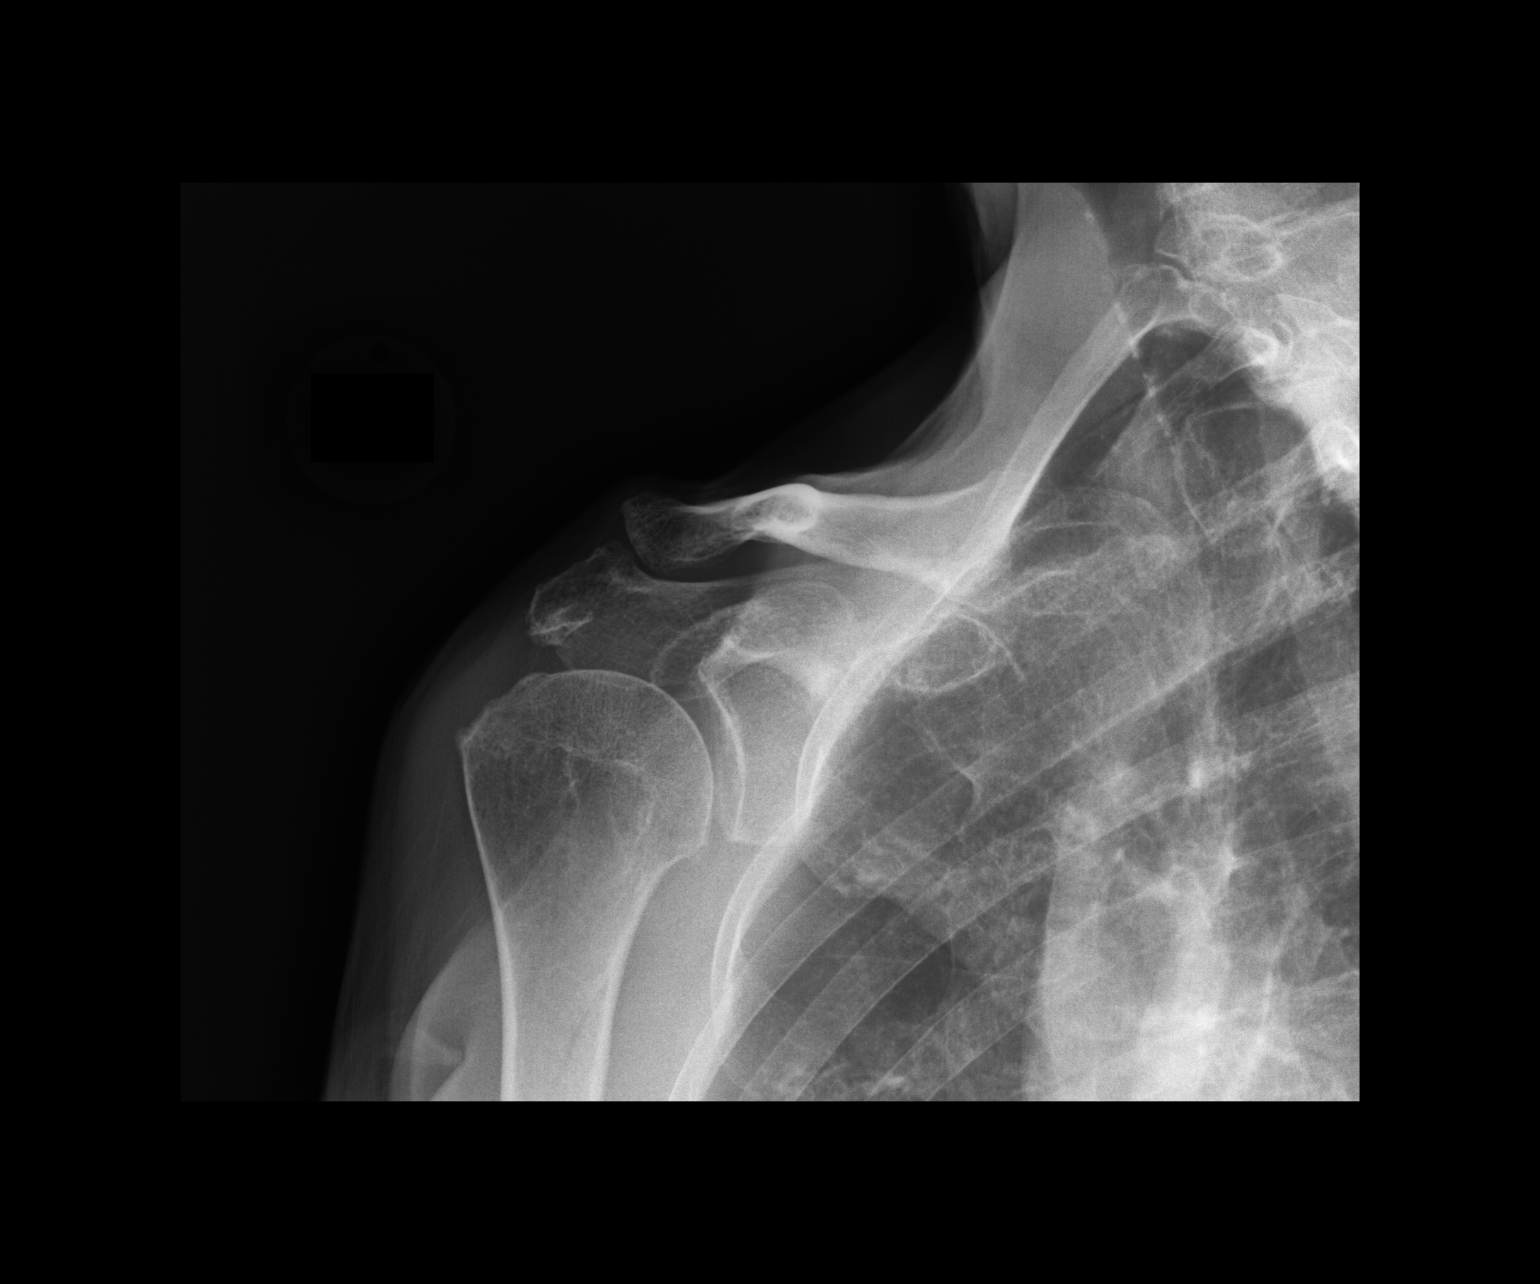

[dg shoulder right (2 of 3)]
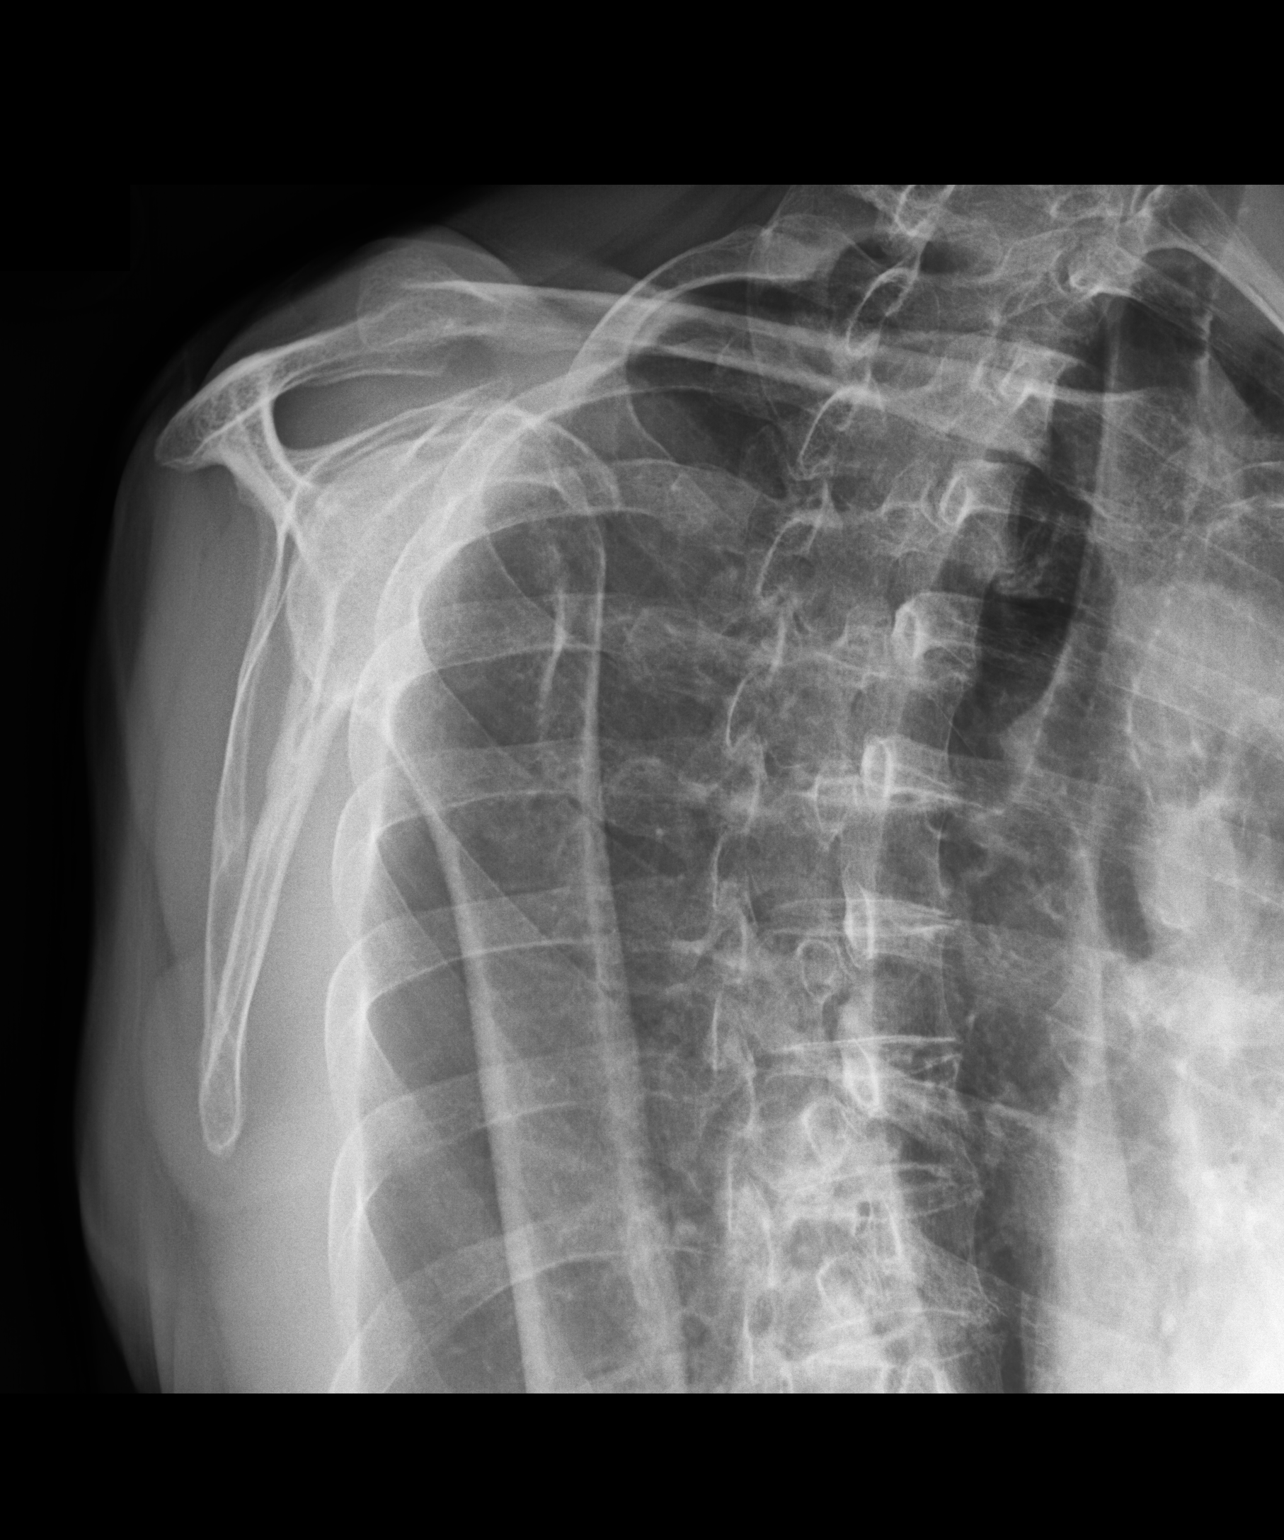

[dg shoulder right (3 of 3)]
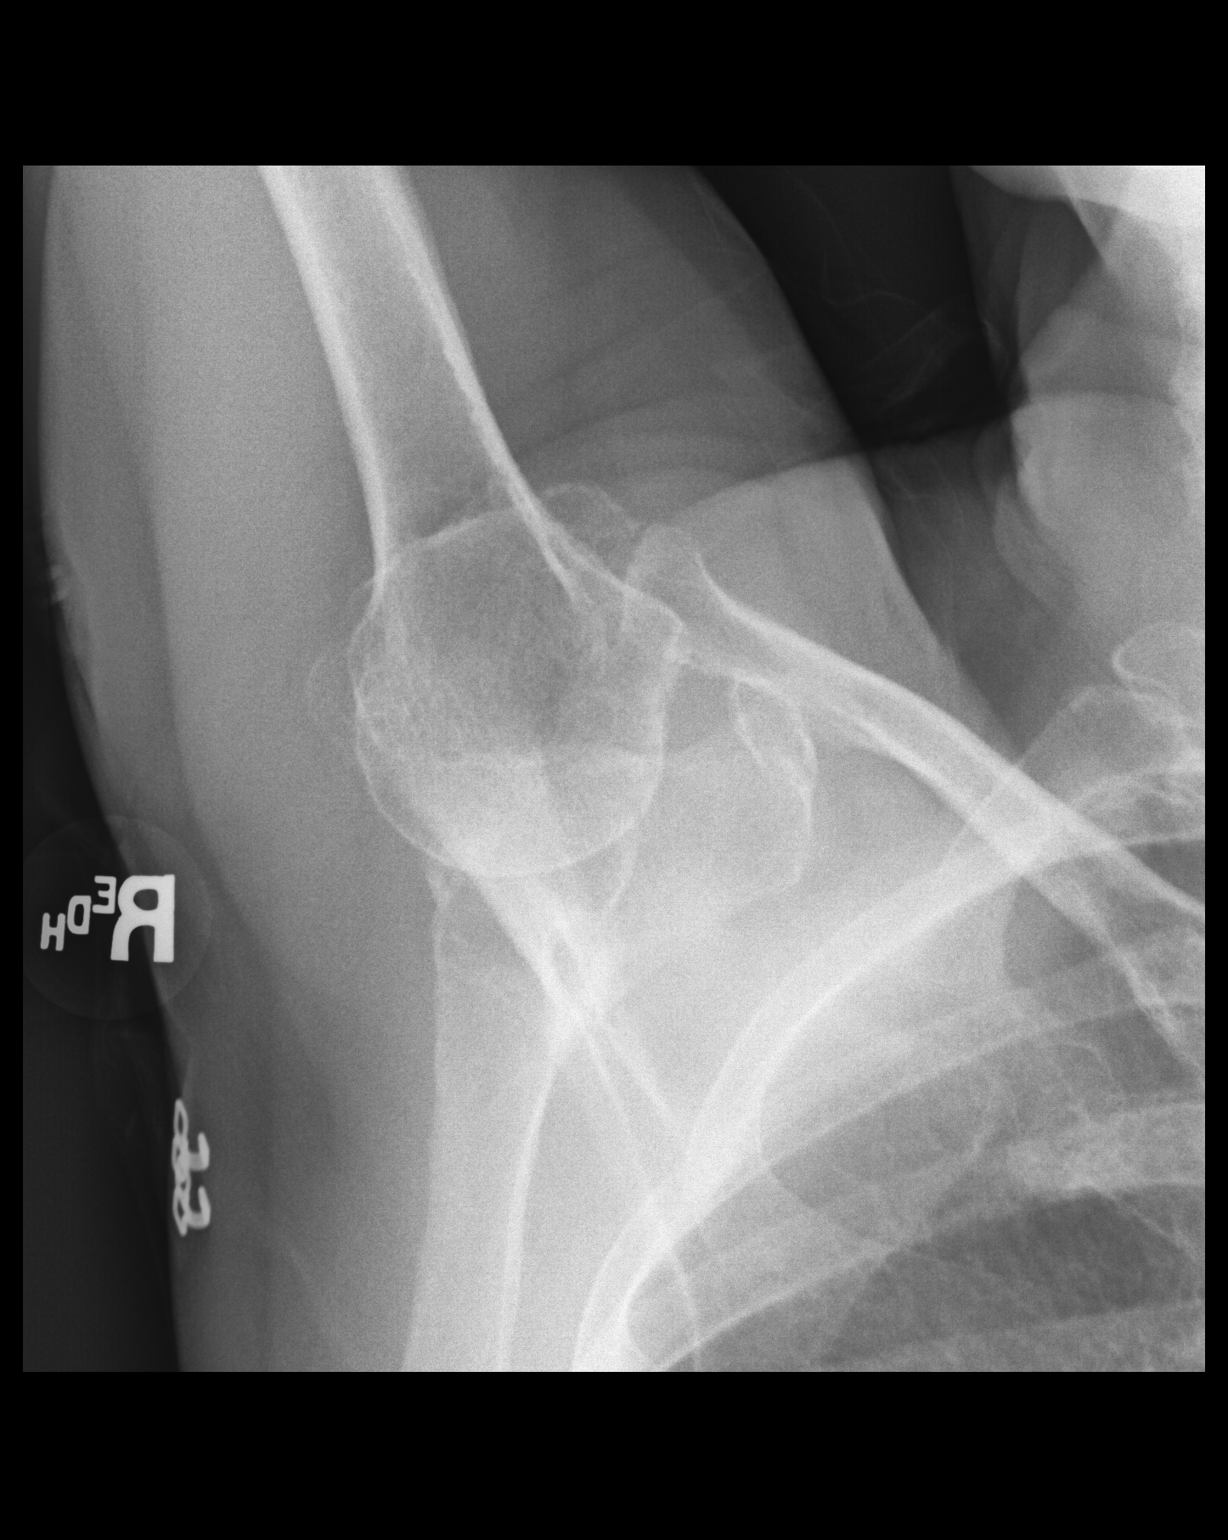

[3 of 3 positions shown; findings below may reference images not displayed]

FINDINGS: Mild acromioclavicular joint degenerative changes.

Minimal spur right greater tuberosity may be related to impingement.

No abnormal soft tissue calcification.

No fracture or dislocation.

Visualized lungs clear.
IMPRESSION: Mild acromioclavicular joint degenerative changes.

Minimal spur right greater tuberosity may be related to impingement.

## 2019-01-24 DIAGNOSIS — L821 Other seborrheic keratosis: Secondary | ICD-10-CM | POA: Diagnosis not present

## 2019-01-24 DIAGNOSIS — Z85828 Personal history of other malignant neoplasm of skin: Secondary | ICD-10-CM | POA: Diagnosis not present

## 2019-01-24 DIAGNOSIS — L820 Inflamed seborrheic keratosis: Secondary | ICD-10-CM | POA: Diagnosis not present

## 2019-02-05 DIAGNOSIS — H04123 Dry eye syndrome of bilateral lacrimal glands: Secondary | ICD-10-CM | POA: Diagnosis not present

## 2019-02-05 DIAGNOSIS — H52203 Unspecified astigmatism, bilateral: Secondary | ICD-10-CM | POA: Diagnosis not present

## 2019-02-05 DIAGNOSIS — H25812 Combined forms of age-related cataract, left eye: Secondary | ICD-10-CM | POA: Diagnosis not present

## 2019-02-05 DIAGNOSIS — H43811 Vitreous degeneration, right eye: Secondary | ICD-10-CM | POA: Diagnosis not present

## 2019-03-30 DIAGNOSIS — L820 Inflamed seborrheic keratosis: Secondary | ICD-10-CM | POA: Diagnosis not present

## 2019-03-30 DIAGNOSIS — L821 Other seborrheic keratosis: Secondary | ICD-10-CM | POA: Diagnosis not present

## 2019-03-30 DIAGNOSIS — Z85828 Personal history of other malignant neoplasm of skin: Secondary | ICD-10-CM | POA: Diagnosis not present

## 2019-03-30 DIAGNOSIS — L812 Freckles: Secondary | ICD-10-CM | POA: Diagnosis not present

## 2019-03-30 DIAGNOSIS — D225 Melanocytic nevi of trunk: Secondary | ICD-10-CM | POA: Diagnosis not present

## 2019-03-30 DIAGNOSIS — D1801 Hemangioma of skin and subcutaneous tissue: Secondary | ICD-10-CM | POA: Diagnosis not present

## 2019-03-30 DIAGNOSIS — D2262 Melanocytic nevi of left upper limb, including shoulder: Secondary | ICD-10-CM | POA: Diagnosis not present

## 2019-03-30 DIAGNOSIS — D2271 Melanocytic nevi of right lower limb, including hip: Secondary | ICD-10-CM | POA: Diagnosis not present

## 2019-04-25 DIAGNOSIS — M810 Age-related osteoporosis without current pathological fracture: Secondary | ICD-10-CM | POA: Diagnosis not present

## 2019-04-25 DIAGNOSIS — Z0000 Encounter for general adult medical examination without abnormal findings: Secondary | ICD-10-CM | POA: Diagnosis not present

## 2019-04-25 DIAGNOSIS — E559 Vitamin D deficiency, unspecified: Secondary | ICD-10-CM | POA: Diagnosis not present

## 2019-04-25 DIAGNOSIS — Z1389 Encounter for screening for other disorder: Secondary | ICD-10-CM | POA: Diagnosis not present

## 2019-04-25 DIAGNOSIS — E7800 Pure hypercholesterolemia, unspecified: Secondary | ICD-10-CM | POA: Diagnosis not present

## 2019-06-18 DIAGNOSIS — Z23 Encounter for immunization: Secondary | ICD-10-CM | POA: Diagnosis not present

## 2019-07-10 DIAGNOSIS — Z01419 Encounter for gynecological examination (general) (routine) without abnormal findings: Secondary | ICD-10-CM | POA: Diagnosis not present

## 2019-07-10 DIAGNOSIS — Z6821 Body mass index (BMI) 21.0-21.9, adult: Secondary | ICD-10-CM | POA: Diagnosis not present

## 2019-07-10 DIAGNOSIS — N958 Other specified menopausal and perimenopausal disorders: Secondary | ICD-10-CM | POA: Diagnosis not present

## 2019-07-10 DIAGNOSIS — Z1231 Encounter for screening mammogram for malignant neoplasm of breast: Secondary | ICD-10-CM | POA: Diagnosis not present

## 2019-07-10 DIAGNOSIS — M816 Localized osteoporosis [Lequesne]: Secondary | ICD-10-CM | POA: Diagnosis not present

## 2019-09-14 ENCOUNTER — Ambulatory Visit: Payer: Medicare Other

## 2019-09-17 DIAGNOSIS — L237 Allergic contact dermatitis due to plants, except food: Secondary | ICD-10-CM | POA: Diagnosis not present

## 2019-09-17 DIAGNOSIS — Z85828 Personal history of other malignant neoplasm of skin: Secondary | ICD-10-CM | POA: Diagnosis not present

## 2019-09-20 DIAGNOSIS — Z23 Encounter for immunization: Secondary | ICD-10-CM | POA: Diagnosis not present

## 2019-09-22 ENCOUNTER — Ambulatory Visit: Payer: Medicare Other

## 2019-10-05 ENCOUNTER — Ambulatory Visit: Payer: Medicare Other

## 2019-10-09 ENCOUNTER — Encounter: Payer: Self-pay | Admitting: Sports Medicine

## 2019-10-09 ENCOUNTER — Ambulatory Visit (INDEPENDENT_AMBULATORY_CARE_PROVIDER_SITE_OTHER): Payer: Medicare Other | Admitting: Sports Medicine

## 2019-10-09 ENCOUNTER — Other Ambulatory Visit: Payer: Self-pay

## 2019-10-09 VITALS — BP 128/88 | Ht 64.5 in | Wt 128.0 lb

## 2019-10-09 DIAGNOSIS — S76311A Strain of muscle, fascia and tendon of the posterior muscle group at thigh level, right thigh, initial encounter: Secondary | ICD-10-CM | POA: Diagnosis not present

## 2019-10-09 NOTE — Patient Instructions (Signed)
Your hip pain is caused by hamstring strain.  We did not see any tears in the hamstring muscle or tendon -Work on a home exercises shown to you at today's visit -Avoid aggressive jumping or leg extension activities during a workout classes.  This includes jumping jacks or jump ropes -Your hamstring injury may take as much as 6 to 12 weeks to fully resolve -You may continue to take over-the-counter anti-inflammatories as needed for pain  We will see back on an as-needed basis

## 2019-10-09 NOTE — Progress Notes (Signed)
PCP: Seward Carol, MD  Subjective:   HPI: Patient is a 69 y.o. female here for evaluation of right hamstring pain.  On Friday patient was doing workout class when she felt a pop and had pain in her gluteal region at her hamstring insertion.  Patient notes her pain is improved slightly since Friday but she still has some pain with walking and leg extension.  Patient denies any previous injury to her hamstrings.  She has no lateral hip pain and no hip pain in her groin.  She has no radiation of pain down her leg.  She denies any numbness or tingling.  Patient has been taking over-the-counter anti-inflammatories which is mildly helped improve her pain.  Patient comes in today to see what activity modification she needs to do if she will be cleared to go back to her workout classes.   Review of Systems: See HPI above.  History reviewed. No pertinent past medical history.  Current Outpatient Medications on File Prior to Visit  Medication Sig Dispense Refill  . ALPRAZolam (XANAX) 0.25 MG tablet Take 0.25 mg by mouth as needed.  3  . diclofenac sodium (VOLTAREN) 1 % GEL Apply 2 g topically 3 (three) times daily as needed. (Patient not taking: Reported on 10/09/2019) 100 g 0  . nitroGLYCERIN (NITRODUR - DOSED IN MG/24 HR) 0.2 mg/hr patch Place 1/4 patch to the affected area daily. (Patient not taking: Reported on 10/09/2019) 30 patch 0  . zolpidem (AMBIEN) 10 MG tablet Take 10 mg by mouth at bedtime as needed.  1   No current facility-administered medications on file prior to visit.    History reviewed. No pertinent surgical history.  No Known Allergies  Social History   Socioeconomic History  . Marital status: Married    Spouse name: Not on file  . Number of children: Not on file  . Years of education: Not on file  . Highest education level: Not on file  Occupational History  . Not on file  Tobacco Use  . Smoking status: Never Smoker  . Smokeless tobacco: Never Used  Substance and  Sexual Activity  . Alcohol use: Not on file  . Drug use: Not on file  . Sexual activity: Not on file  Other Topics Concern  . Not on file  Social History Narrative  . Not on file   Social Determinants of Health   Financial Resource Strain:   . Difficulty of Paying Living Expenses: Not on file  Food Insecurity:   . Worried About Charity fundraiser in the Last Year: Not on file  . Ran Out of Food in the Last Year: Not on file  Transportation Needs:   . Lack of Transportation (Medical): Not on file  . Lack of Transportation (Non-Medical): Not on file  Physical Activity:   . Days of Exercise per Week: Not on file  . Minutes of Exercise per Session: Not on file  Stress:   . Feeling of Stress : Not on file  Social Connections:   . Frequency of Communication with Friends and Family: Not on file  . Frequency of Social Gatherings with Friends and Family: Not on file  . Attends Religious Services: Not on file  . Active Member of Clubs or Organizations: Not on file  . Attends Archivist Meetings: Not on file  . Marital Status: Not on file  Intimate Partner Violence:   . Fear of Current or Ex-Partner: Not on file  . Emotionally Abused: Not  on file  . Physically Abused: Not on file  . Sexually Abused: Not on file    History reviewed. No pertinent family history.      Objective:  Physical Exam: BP 128/88   Ht 5' 4.5" (1.638 m)   Wt 128 lb (58.1 kg)   BMI 21.63 kg/m  Gen: NAD, comfortable in exam room Lungs: Breathing comfortably on room air Hip Exam Right -Inspection: No deformity, no discoloration -Palpation: Tenderness palpation at the right ischial tuberosity. -ROM: Normal ROM with flexion, extension, abduction, internal rotation, external rotation -Strength: Flexion: 5/5; Extension 5/5; Abduction: 5/5.  Resisted extension of the hip reproduce pain.  Patient also had 5 out of 5 strength with knee flexion extension. -Limb neurovascularly intact  Limited  diagnostic ultrasound of the right hip Findings: -Mild cortical regularity at the ischial tuberosity.  These changes appear chronic. -Normal appearance of the hamstring tendon at the insertion.  No tears noted. -No tears seen within the proximal hamstring muscle bellies. Impression: -Normal ultrasound examination of the hamstring insertion   Assessment & Plan:  Patient is a 69 y.o. female here for evaluation of right hip pain  1.  Grade 1 right hamstring strain -Ultrasound findings showing no acute tears of the hamstring or hamstring tendon -Patient given home exercise program for hamstring strengthening rehab -Patient advised to avoid any high velocity jumping activities or aggressive hamstring workouts until her pain improves -Patient may continue to take over-the-counter anti-inflammatories as needed for pain  Patient will follow up on an as-needed basis  Patient seen and evaluated with the sports medicine fellow.  I agree with the above plan of care.  Patient has a mild strain of the proximal hamstring tendon on the right.  Treatment as above and follow-up for ongoing or recalcitrant issues.

## 2019-10-19 DIAGNOSIS — Z23 Encounter for immunization: Secondary | ICD-10-CM | POA: Diagnosis not present

## 2020-03-24 DIAGNOSIS — Z85828 Personal history of other malignant neoplasm of skin: Secondary | ICD-10-CM | POA: Diagnosis not present

## 2020-03-24 DIAGNOSIS — D2371 Other benign neoplasm of skin of right lower limb, including hip: Secondary | ICD-10-CM | POA: Diagnosis not present

## 2020-03-24 DIAGNOSIS — L812 Freckles: Secondary | ICD-10-CM | POA: Diagnosis not present

## 2020-03-24 DIAGNOSIS — D1801 Hemangioma of skin and subcutaneous tissue: Secondary | ICD-10-CM | POA: Diagnosis not present

## 2020-03-24 DIAGNOSIS — L821 Other seborrheic keratosis: Secondary | ICD-10-CM | POA: Diagnosis not present

## 2020-03-24 DIAGNOSIS — D225 Melanocytic nevi of trunk: Secondary | ICD-10-CM | POA: Diagnosis not present

## 2020-05-14 DIAGNOSIS — M810 Age-related osteoporosis without current pathological fracture: Secondary | ICD-10-CM | POA: Diagnosis not present

## 2020-05-14 DIAGNOSIS — Z0000 Encounter for general adult medical examination without abnormal findings: Secondary | ICD-10-CM | POA: Diagnosis not present

## 2020-05-14 DIAGNOSIS — E7800 Pure hypercholesterolemia, unspecified: Secondary | ICD-10-CM | POA: Diagnosis not present

## 2020-05-14 DIAGNOSIS — H9193 Unspecified hearing loss, bilateral: Secondary | ICD-10-CM | POA: Diagnosis not present

## 2020-05-14 DIAGNOSIS — E559 Vitamin D deficiency, unspecified: Secondary | ICD-10-CM | POA: Diagnosis not present

## 2020-05-14 DIAGNOSIS — R5383 Other fatigue: Secondary | ICD-10-CM | POA: Diagnosis not present

## 2020-05-14 DIAGNOSIS — G4700 Insomnia, unspecified: Secondary | ICD-10-CM | POA: Diagnosis not present

## 2020-05-30 DIAGNOSIS — R300 Dysuria: Secondary | ICD-10-CM | POA: Diagnosis not present

## 2020-06-03 ENCOUNTER — Other Ambulatory Visit: Payer: Self-pay | Admitting: Obstetrics & Gynecology

## 2020-06-03 DIAGNOSIS — Z1231 Encounter for screening mammogram for malignant neoplasm of breast: Secondary | ICD-10-CM

## 2020-07-01 ENCOUNTER — Ambulatory Visit
Admission: RE | Admit: 2020-07-01 | Discharge: 2020-07-01 | Disposition: A | Discharge status: Home / Self Care | Payer: Medicare Other | Source: Ambulatory Visit | Attending: Obstetrics & Gynecology | Admitting: Obstetrics & Gynecology

## 2020-07-01 ENCOUNTER — Other Ambulatory Visit: Payer: Self-pay

## 2020-07-01 DIAGNOSIS — Z1231 Encounter for screening mammogram for malignant neoplasm of breast: Secondary | ICD-10-CM

## 2020-07-05 DIAGNOSIS — B029 Zoster without complications: Secondary | ICD-10-CM | POA: Diagnosis not present

## 2020-07-22 ENCOUNTER — Ambulatory Visit (INDEPENDENT_AMBULATORY_CARE_PROVIDER_SITE_OTHER): Payer: Medicare Other | Admitting: Sports Medicine

## 2020-07-22 ENCOUNTER — Other Ambulatory Visit: Payer: Self-pay

## 2020-07-22 VITALS — BP 153/82 | Ht 64.5 in | Wt 130.0 lb

## 2020-07-22 DIAGNOSIS — M25512 Pain in left shoulder: Secondary | ICD-10-CM

## 2020-07-22 MED ORDER — LIDOCAINE 5 % EX PTCH: MEDICATED_PATCH | CUTANEOUS | 0 refills | Status: DC

## 2020-07-22 NOTE — Patient Instructions (Signed)
  I am not sure whether your pain is postherpetic neuralgia from your shingles or a rhomboid strain so we will treat you for both. I have given you a prescription for Lidoderm patches.  You may want to try them only at night since your pain seems to be worse then. Another option would be capsaicin cream which is available over-the-counter but you must wash your hands after using this. We will give you some exercises for your rhomboid muscles. Follow-up with your PCP for your shingles vaccine when your rash clears.

## 2020-07-23 ENCOUNTER — Encounter: Payer: Self-pay | Admitting: Sports Medicine

## 2020-07-23 ENCOUNTER — Other Ambulatory Visit: Payer: Self-pay

## 2020-07-23 MED ORDER — LIDOCAINE 5 % EX PTCH: MEDICATED_PATCH | CUTANEOUS | 0 refills | Status: DC

## 2020-07-23 NOTE — Progress Notes (Signed)
Patient asked for Rx be sent to [facility] since [facility] was too expensive.

## 2020-07-23 NOTE — Progress Notes (Signed)
   Subjective:    Patient ID: Beth Clark, female    DOB: 02-07-1951, 68 y.o.   MRN: [id number]  HPI chief complaint: Left shoulder pain/upper back  Beth Clark comes in today complaining of several weeks of left shoulder pain that she localizes to the rhomboid area near the left scapula.  She began to have some nonspecific pain in this area that lasted for a week and a half before she developed a rash consistent with shingles.  She is currently on Lyrica for her shingles pain which has been helpful but she is unsure whether or not her current discomfort is due to shingles or a muscle issue.  She does describe a constant discomfort in the left scapular area which is much worse at night.  It is tolerable during the day.  She denies any injury.  She denies pain elsewhere in the shoulder or arm.  Past medical history reviewed Medications reviewed Allergies reviewed    Review of Systems    As above Objective:   Physical Exam  Well-developed, well-nourished.  No acute distress  Skin: There is a resolving rash just inferior to the left scapula consistent with varicella Left shoulder: Full painless shoulder range of motion.  There is some tenderness to palpation at the left rhomboid but no soft tissue swelling.  Good scapular mobility.  Full strength.  Neurovascular intact distally.      Assessment & Plan:   Left shoulder pain Resolving shingles  It is difficult for me to tell whether or not her rhomboid pain is due to muscle strain or postherpetic neuralgia.  Therefore, we will treat for both.  I recommended that she try some topical Lidoderm patches at night.  She may also try capsaicin.  We will give her some scapular stabilizer exercises.  She has 5 days of her Lyrica left so she will finish that but will wean to taking it once a day instead of twice daily.  Hopefully this is not herpetic neuralgia, but if it is, she understands that it can take several weeks or months before resolution. Of  note, she did receive her shingles vaccine many years ago but I have asked her to discuss revaccination with her PCP.

## 2020-08-12 DIAGNOSIS — M25512 Pain in left shoulder: Secondary | ICD-10-CM

## 2020-08-12 DIAGNOSIS — M549 Dorsalgia, unspecified: Secondary | ICD-10-CM

## 2020-08-12 NOTE — Addendum Note (Signed)
Addended by: Annita Brod on: 08/12/2020 05:58 PM   Modules accepted: Orders

## 2020-08-13 ENCOUNTER — Ambulatory Visit
Admission: RE | Admit: 2020-08-13 | Discharge: 2020-08-13 | Disposition: A | Discharge status: Home / Self Care | Payer: Medicare Other | Source: Ambulatory Visit | Attending: Sports Medicine | Admitting: Sports Medicine

## 2020-08-13 ENCOUNTER — Other Ambulatory Visit: Payer: Self-pay | Admitting: Sports Medicine

## 2020-08-13 DIAGNOSIS — M25512 Pain in left shoulder: Secondary | ICD-10-CM

## 2020-08-13 DIAGNOSIS — M549 Dorsalgia, unspecified: Secondary | ICD-10-CM

## 2020-08-13 DIAGNOSIS — M546 Pain in thoracic spine: Secondary | ICD-10-CM | POA: Diagnosis not present

## 2020-08-18 ENCOUNTER — Telehealth: Payer: Self-pay | Admitting: Sports Medicine

## 2020-08-18 ENCOUNTER — Encounter: Payer: Self-pay | Admitting: *Deleted

## 2020-08-18 NOTE — Telephone Encounter (Signed)
  I spoke with Makaley on the phone today after she had called requesting some x-rays of her spine and shoulder.  Those x-rays are unremarkable.  She is still having some periscapular pain post zoster.  She tells me that her PCP has extended her Lyrica for 2 months.  She does think that it is somewhat helpful.  She also tells me that direct pressure over the area, such as with a massage, causes discomfort and that the pain will radiate toward the spine.  She continues to deny true shoulder pain.  She denies radiculopathy into the arm.  I explained to Kadence that I really think that her pain is all postherpetic.  She does admit that it is slowly improving and I explained to her that it can take several weeks or months for this type of pain to completely resolve.  She will stay on her Lyrica and continue to monitor her symptoms.  Follow-up with her PCP as scheduled.

## 2020-09-26 ENCOUNTER — Other Ambulatory Visit: Payer: Self-pay | Admitting: Sports Medicine

## 2020-11-13 DIAGNOSIS — L299 Pruritus, unspecified: Secondary | ICD-10-CM | POA: Diagnosis not present

## 2020-11-13 DIAGNOSIS — R070 Pain in throat: Secondary | ICD-10-CM | POA: Diagnosis not present

## 2020-12-22 DIAGNOSIS — R8290 Unspecified abnormal findings in urine: Secondary | ICD-10-CM | POA: Diagnosis not present

## 2020-12-22 DIAGNOSIS — Z6822 Body mass index (BMI) 22.0-22.9, adult: Secondary | ICD-10-CM | POA: Diagnosis not present

## 2020-12-22 DIAGNOSIS — R82998 Other abnormal findings in urine: Secondary | ICD-10-CM | POA: Diagnosis not present

## 2020-12-22 DIAGNOSIS — Z124 Encounter for screening for malignant neoplasm of cervix: Secondary | ICD-10-CM | POA: Diagnosis not present

## 2021-01-08 ENCOUNTER — Other Ambulatory Visit (HOSPITAL_COMMUNITY): Payer: Self-pay | Admitting: Internal Medicine

## 2021-01-19 ENCOUNTER — Ambulatory Visit (HOSPITAL_COMMUNITY)
Admission: RE | Admit: 2021-01-19 | Discharge: 2021-01-19 | Disposition: A | Discharge status: Home / Self Care | Payer: Medicare Other | Source: Ambulatory Visit | Attending: Internal Medicine | Admitting: Internal Medicine

## 2021-01-19 ENCOUNTER — Other Ambulatory Visit: Payer: Self-pay

## 2021-01-19 DIAGNOSIS — I712 Thoracic aortic aneurysm, without rupture: Secondary | ICD-10-CM | POA: Insufficient documentation

## 2021-01-19 DIAGNOSIS — R918 Other nonspecific abnormal finding of lung field: Secondary | ICD-10-CM | POA: Insufficient documentation

## 2021-01-19 DIAGNOSIS — E7800 Pure hypercholesterolemia, unspecified: Secondary | ICD-10-CM | POA: Insufficient documentation

## 2021-01-26 DIAGNOSIS — I77810 Thoracic aortic ectasia: Secondary | ICD-10-CM | POA: Diagnosis not present

## 2021-01-26 DIAGNOSIS — E7800 Pure hypercholesterolemia, unspecified: Secondary | ICD-10-CM | POA: Diagnosis not present

## 2021-01-26 DIAGNOSIS — I700 Atherosclerosis of aorta: Secondary | ICD-10-CM | POA: Diagnosis not present

## 2021-01-26 DIAGNOSIS — R9389 Abnormal findings on diagnostic imaging of other specified body structures: Secondary | ICD-10-CM | POA: Diagnosis not present

## 2021-02-10 DIAGNOSIS — H524 Presbyopia: Secondary | ICD-10-CM | POA: Diagnosis not present

## 2021-02-10 DIAGNOSIS — Z961 Presence of intraocular lens: Secondary | ICD-10-CM | POA: Diagnosis not present

## 2021-02-10 DIAGNOSIS — H2512 Age-related nuclear cataract, left eye: Secondary | ICD-10-CM | POA: Diagnosis not present

## 2021-02-13 DIAGNOSIS — H903 Sensorineural hearing loss, bilateral: Secondary | ICD-10-CM | POA: Diagnosis not present

## 2021-02-13 DIAGNOSIS — L299 Pruritus, unspecified: Secondary | ICD-10-CM | POA: Diagnosis not present

## 2021-03-31 DIAGNOSIS — L820 Inflamed seborrheic keratosis: Secondary | ICD-10-CM | POA: Diagnosis not present

## 2021-03-31 DIAGNOSIS — D225 Melanocytic nevi of trunk: Secondary | ICD-10-CM | POA: Diagnosis not present

## 2021-03-31 DIAGNOSIS — Z85828 Personal history of other malignant neoplasm of skin: Secondary | ICD-10-CM | POA: Diagnosis not present

## 2021-03-31 DIAGNOSIS — L821 Other seborrheic keratosis: Secondary | ICD-10-CM | POA: Diagnosis not present

## 2021-03-31 DIAGNOSIS — D2261 Melanocytic nevi of right upper limb, including shoulder: Secondary | ICD-10-CM | POA: Diagnosis not present

## 2021-03-31 DIAGNOSIS — D2262 Melanocytic nevi of left upper limb, including shoulder: Secondary | ICD-10-CM | POA: Diagnosis not present

## 2021-03-31 DIAGNOSIS — D220 Melanocytic nevi of lip: Secondary | ICD-10-CM | POA: Diagnosis not present

## 2021-03-31 DIAGNOSIS — D2239 Melanocytic nevi of other parts of face: Secondary | ICD-10-CM | POA: Diagnosis not present

## 2021-05-20 DIAGNOSIS — I77810 Thoracic aortic ectasia: Secondary | ICD-10-CM | POA: Diagnosis not present

## 2021-05-20 DIAGNOSIS — G4700 Insomnia, unspecified: Secondary | ICD-10-CM | POA: Diagnosis not present

## 2021-05-20 DIAGNOSIS — E559 Vitamin D deficiency, unspecified: Secondary | ICD-10-CM | POA: Diagnosis not present

## 2021-05-20 DIAGNOSIS — E7800 Pure hypercholesterolemia, unspecified: Secondary | ICD-10-CM | POA: Diagnosis not present

## 2021-05-20 DIAGNOSIS — Z5181 Encounter for therapeutic drug level monitoring: Secondary | ICD-10-CM | POA: Diagnosis not present

## 2021-05-20 DIAGNOSIS — M810 Age-related osteoporosis without current pathological fracture: Secondary | ICD-10-CM | POA: Diagnosis not present

## 2021-05-20 DIAGNOSIS — Z0000 Encounter for general adult medical examination without abnormal findings: Secondary | ICD-10-CM | POA: Diagnosis not present

## 2021-05-20 DIAGNOSIS — I700 Atherosclerosis of aorta: Secondary | ICD-10-CM | POA: Diagnosis not present

## 2021-05-21 ENCOUNTER — Other Ambulatory Visit: Payer: Self-pay | Admitting: Internal Medicine

## 2021-05-21 DIAGNOSIS — M810 Age-related osteoporosis without current pathological fracture: Secondary | ICD-10-CM

## 2021-06-22 ENCOUNTER — Other Ambulatory Visit: Payer: Self-pay | Admitting: Internal Medicine

## 2021-06-22 DIAGNOSIS — Z1231 Encounter for screening mammogram for malignant neoplasm of breast: Secondary | ICD-10-CM

## 2021-06-30 DIAGNOSIS — Z23 Encounter for immunization: Secondary | ICD-10-CM | POA: Diagnosis not present

## 2021-07-24 ENCOUNTER — Ambulatory Visit
Admission: RE | Admit: 2021-07-24 | Discharge: 2021-07-24 | Disposition: A | Discharge status: Home / Self Care | Payer: Medicare Other | Source: Ambulatory Visit | Attending: Internal Medicine | Admitting: Internal Medicine

## 2021-07-24 DIAGNOSIS — Z1231 Encounter for screening mammogram for malignant neoplasm of breast: Secondary | ICD-10-CM

## 2021-08-14 ENCOUNTER — Encounter (HOSPITAL_COMMUNITY): Payer: Self-pay | Admitting: Emergency Medicine

## 2021-08-14 ENCOUNTER — Other Ambulatory Visit: Payer: Self-pay

## 2021-08-14 ENCOUNTER — Emergency Department (HOSPITAL_COMMUNITY)
Admission: EM | Admit: 2021-08-14 | Discharge: 2021-08-14 | Disposition: A | Discharge status: Home / Self Care | Payer: Medicare Other | Attending: Emergency Medicine | Admitting: Emergency Medicine

## 2021-08-14 DIAGNOSIS — R55 Syncope and collapse: Secondary | ICD-10-CM | POA: Diagnosis not present

## 2021-08-14 DIAGNOSIS — I2510 Atherosclerotic heart disease of native coronary artery without angina pectoris: Secondary | ICD-10-CM | POA: Insufficient documentation

## 2021-08-14 DIAGNOSIS — E162 Hypoglycemia, unspecified: Secondary | ICD-10-CM | POA: Diagnosis not present

## 2021-08-14 DIAGNOSIS — R410 Disorientation, unspecified: Secondary | ICD-10-CM | POA: Insufficient documentation

## 2021-08-14 HISTORY — DX: Atherosclerotic heart disease of native coronary artery without angina pectoris: I25.10

## 2021-08-14 HISTORY — DX: Unspecified osteoarthritis, unspecified site: M19.90

## 2021-08-14 LAB — CBC
HCT: 43.6 % (ref 36.0–46.0)
Hemoglobin: 14.9 g/dL (ref 12.0–15.0)
MCH: 29.7 pg (ref 26.0–34.0)
MCHC: 34.2 g/dL (ref 30.0–36.0)
MCV: 87 fL (ref 80.0–100.0)
Platelets: 267 10*3/uL (ref 150–400)
RBC: 5.01 MIL/uL (ref 3.87–5.11)
RDW: 13.1 % (ref 11.5–15.5)
WBC: 7.7 10*3/uL (ref 4.0–10.5)
nRBC: 0 % (ref 0.0–0.2)

## 2021-08-14 LAB — BASIC METABOLIC PANEL
Anion gap: 9 (ref 5–15)
BUN: 15 mg/dL (ref 8–23)
CO2: 25 mmol/L (ref 22–32)
Calcium: 9.5 mg/dL (ref 8.9–10.3)
Chloride: 105 mmol/L (ref 98–111)
Creatinine, Ser: 0.68 mg/dL (ref 0.44–1.00)
GFR, Estimated: >60 mL/min (ref >60)
Glucose, Bld: 108 mg/dL — ABNORMAL HIGH (ref 70–99)
Potassium: 4.1 mmol/L (ref 3.5–5.1)
Sodium: 139 mmol/L (ref 135–145)

## 2021-08-14 LAB — CBG MONITORING, ED: Glucose-Capillary: 104 mg/dL — ABNORMAL HIGH (ref 70–99)

## 2021-08-14 NOTE — ED Provider Notes (Signed)
[facility] EMERGENCY DEPARTMENT Provider Note   CSN: [id number] Arrival date & time: 08/14/21  1011     History Chief Complaint  Patient presents with   Altered Mental Status   Near Syncope    Beth Clark is a 70 y.o. female hx of arthritis, anxiety, here presenting with near syncope.  Patient states that she finished 100 pull-ups this morning at the gym.  She states that shortly afterwards, she felt confused.  Patient states that she was slightly disoriented at that time.  She thought her blood sugar was low so drink some chocolate milk.  She then went home and came straight to the ER.  She states that several hours into the ER visit, she felt back to her normal self.  Denies any trouble speaking.  Denies any focal weakness or seizure-like activity.  The history is provided by the patient.      Past Medical History:  Diagnosis Date   Arthritis    Coronary artery disease     Patient Active Problem List   Diagnosis Date Noted   Palpitations 01/19/2016   Right shoulder pain 11/04/2014   Plantar fasciitis 05/09/2012   Gait abnormality 05/09/2012   Knee pain 05/03/2012    No past surgical history on file.   OB History   No obstetric history on file.     No family history on file.  Social History   Tobacco Use   Smoking status: Never   Smokeless tobacco: Never  Substance Use Topics   Alcohol use: Yes   Drug use: Never    Home Medications Prior to Admission medications   Medication Sig Start Date End Date Taking? Authorizing Provider  ALPRAZolam (XANAX) 0.25 MG tablet Take 0.25 mg by mouth as needed. 07/12/17   [provider]  diclofenac sodium (VOLTAREN) 1 % GEL Apply 2 g topically 3 (three) times daily as needed. Patient not taking: Reported on 10/09/2019 02/14/18   Lake, Christoper P, MD  lidocaine (LIDODERM) 5 % apply 1 new  patch to the skin every 12 hour prior to bedtime as needed. 09/29/20   Thurman Coyer, DO   nitroGLYCERIN (NITRODUR - DOSED IN MG/24 HR) 0.2 mg/hr patch Place 1/4 patch to the affected area daily. Patient not taking: Reported on 10/09/2019 01/11/18   Lake, Christoper P, MD  zolpidem (AMBIEN) 10 MG tablet Take 10 mg by mouth at bedtime as needed. 07/22/15   [provider]    Allergies    Patient has no known allergies.  Review of Systems   Review of Systems  Psychiatric/Behavioral:  Positive for confusion.   All other systems reviewed and are negative.  Physical Exam Updated Vital Signs BP (!) 150/88 (BP Location: Right Arm)    Pulse 78    Temp 98.3 F (36.8 C) (Oral)    Resp 16    SpO2 100%   Physical Exam Vitals and nursing note reviewed.  Constitutional:      Comments: Well-appearing, no obvious focal deficit  HENT:     Head: Normocephalic.     Nose: Nose normal.     Mouth/Throat:     Mouth: Mucous membranes are moist.  Eyes:     Extraocular Movements: Extraocular movements intact.     Pupils: Pupils are equal, round, and reactive to light.  Cardiovascular:     Rate and Rhythm: Normal rate and regular rhythm.     Pulses: Normal pulses.     Heart sounds: Normal heart  sounds.  Pulmonary:     Effort: Pulmonary effort is normal.     Breath sounds: Normal breath sounds.  Abdominal:     General: Abdomen is flat.     Palpations: Abdomen is soft.  Musculoskeletal:        General: Normal range of motion.     Cervical back: Normal range of motion and neck supple.  Skin:    General: Skin is warm.     Capillary Refill: Capillary refill takes less than 2 seconds.  Neurological:     General: No focal deficit present.     Mental Status: She is oriented to person, place, and time.     Comments: No facial droop, no slurred speech.  Cranial nerves II to XII intact.  Patient has normal strength and sensation bilateral arms and legs  Psychiatric:        Mood and Affect: Mood normal.        Behavior: Behavior normal.    ED Results / Procedures / Treatments    Labs (all labs ordered are listed, but only abnormal results are displayed) Labs Reviewed  BASIC METABOLIC PANEL - Abnormal; Notable for the following components:      Result Value   Glucose, Bld 108 (*)    All other components within normal limits  CBG MONITORING, ED - Abnormal; Notable for the following components:   Glucose-Capillary 104 (*)    All other components within normal limits  CBC  URINALYSIS, ROUTINE W REFLEX MICROSCOPIC    EKG EKG Interpretation  Date/Time:  Friday August 14 2021 10:46:33 EST Ventricular Rate:  91 PR Interval:  160 QRS Duration: 84 QT Interval:  374 QTC Calculation: 460 R Axis:   -45 Text Interpretation: Normal sinus rhythm with sinus arrhythmia Left axis deviation Nonspecific ST abnormality Abnormal ECG When compared with ECG of 16-Aug-2004 19:43, PREVIOUS ECG IS PRESENT Confirmed by Wandra Arthurs [phone]) on 08/14/2021 4:12:23 PM  Radiology No results found.  Procedures Procedures   Medications Ordered in ED Medications - No data to display  ED Course  I have reviewed the triage vital signs and the nursing notes.  Pertinent labs & imaging results that were available during my care of the patient were reviewed by me and considered in my medical decision making (see chart for details).    MDM Rules/Calculators/A&P                         Beth Clark is a 70 y.o. female here presenting with confusion after work out.  Consider transient hypoglycemia versus dehydration.  Patient is back to baseline now.  She has nonfocal neuro exam.  Will check CBC and BMP.  4:17 PM Patient's glucose is 100.  CBC and BMP unremarkable.  Stable for discharge     Final Clinical Impression(s) / ED Diagnoses Final diagnoses:  None    Rx / DC Orders ED Discharge Orders     None        Drenda Freeze, MD 08/14/21 [phone]

## 2021-08-14 NOTE — Discharge Instructions (Signed)
Your EKG and labs were unremarkable today. It is likely that your transient confusion was from your blood sugar dropping low or from your work out  Please stay hydrated.  Follow-up with your doctor.  Return to ER if you have any trouble speaking, weakness, numbness, seizure-like activity, chest pain

## 2021-08-14 NOTE — ED Triage Notes (Signed)
PT reports was exercising began feeling weak, unable to finish exercises, had some confusion. Pts blood pressure was noted to be elevated on scene, no hx of HTN per pt. CAme in for furhter eval.

## 2021-08-14 NOTE — ED Provider Notes (Signed)
Emergency Medicine Provider Triage Evaluation Note  Beth Clark , a 70 y.o. female  was evaluated in triage.  Pt complains of an abrupt onset of confusion about 3 hours ago.  Patient states that she was at the gym when she had sudden onset of feeling shaky, and was confused about where she was.  She noted that her workout partner told her she seemed confused.  They gave her 2 baby aspirin, which did not relieve any of her symptoms.  They also were concerned that her blood sugar was low, and gave her chocolate milk.  No history of stroke, or heart conditions.  She feels like the confusion has improved.  Review of Systems  Positive: Shakiness, confusion Negative: Headache, weakness, CP, SOB  Physical Exam  BP (!) 172/91 (BP Location: Right Arm)    Pulse 93    Temp 98.3 F (36.8 C) (Oral)    Resp 16    SpO2 98%  Gen:   Awake, no distress   Resp:  Normal effort  MSK:   Moves extremities without difficulty  Other:    Medical Decision Making  Medically screening exam initiated at 11:14 AM.  Appropriate orders placed.  Beth Clark was informed that the remainder of the evaluation will be completed by another provider, this initial triage assessment does not replace that evaluation, and the importance of remaining in the ED until their evaluation is complete.     Estill Cotta 08/14/21 [id number]    Regan Lemming, MD 08/14/21 1222

## 2021-08-26 DIAGNOSIS — R55 Syncope and collapse: Secondary | ICD-10-CM | POA: Diagnosis not present

## 2021-08-26 DIAGNOSIS — R8290 Unspecified abnormal findings in urine: Secondary | ICD-10-CM | POA: Diagnosis not present

## 2021-09-07 DIAGNOSIS — R3989 Other symptoms and signs involving the genitourinary system: Secondary | ICD-10-CM | POA: Diagnosis not present

## 2021-11-20 ENCOUNTER — Ambulatory Visit
Admission: RE | Admit: 2021-11-20 | Discharge: 2021-11-20 | Disposition: A | Discharge status: Home / Self Care | Payer: Medicare Other | Source: Ambulatory Visit | Attending: Internal Medicine | Admitting: Internal Medicine

## 2021-11-20 DIAGNOSIS — Z780 Asymptomatic menopausal state: Secondary | ICD-10-CM | POA: Diagnosis not present

## 2021-11-20 DIAGNOSIS — M810 Age-related osteoporosis without current pathological fracture: Secondary | ICD-10-CM | POA: Diagnosis not present

## 2021-11-20 DIAGNOSIS — M8588 Other specified disorders of bone density and structure, other site: Secondary | ICD-10-CM | POA: Diagnosis not present

## 2022-01-14 DIAGNOSIS — R8290 Unspecified abnormal findings in urine: Secondary | ICD-10-CM | POA: Diagnosis not present

## 2022-01-14 DIAGNOSIS — Z01419 Encounter for gynecological examination (general) (routine) without abnormal findings: Secondary | ICD-10-CM | POA: Diagnosis not present

## 2022-01-14 DIAGNOSIS — Z6821 Body mass index (BMI) 21.0-21.9, adult: Secondary | ICD-10-CM | POA: Diagnosis not present

## 2022-01-14 DIAGNOSIS — N390 Urinary tract infection, site not specified: Secondary | ICD-10-CM | POA: Diagnosis not present

## 2022-02-15 NOTE — Addendum Note (Signed)
Encounter addended by: Annie Paras on: 02/15/2022 3:51 PM  Actions taken: Letter saved

## 2022-02-20 DIAGNOSIS — Z20822 Contact with and (suspected) exposure to covid-19: Secondary | ICD-10-CM | POA: Diagnosis not present

## 2022-02-20 DIAGNOSIS — Z03818 Encounter for observation for suspected exposure to other biological agents ruled out: Secondary | ICD-10-CM | POA: Diagnosis not present

## 2022-02-20 DIAGNOSIS — Z6821 Body mass index (BMI) 21.0-21.9, adult: Secondary | ICD-10-CM | POA: Diagnosis not present

## 2022-02-20 DIAGNOSIS — J029 Acute pharyngitis, unspecified: Secondary | ICD-10-CM | POA: Diagnosis not present

## 2022-02-23 DIAGNOSIS — H2512 Age-related nuclear cataract, left eye: Secondary | ICD-10-CM | POA: Diagnosis not present

## 2022-02-23 DIAGNOSIS — H52203 Unspecified astigmatism, bilateral: Secondary | ICD-10-CM | POA: Diagnosis not present

## 2022-04-12 DIAGNOSIS — C44712 Basal cell carcinoma of skin of right lower limb, including hip: Secondary | ICD-10-CM | POA: Diagnosis not present

## 2022-04-12 DIAGNOSIS — D1801 Hemangioma of skin and subcutaneous tissue: Secondary | ICD-10-CM | POA: Diagnosis not present

## 2022-04-12 DIAGNOSIS — L821 Other seborrheic keratosis: Secondary | ICD-10-CM | POA: Diagnosis not present

## 2022-04-12 DIAGNOSIS — L720 Epidermal cyst: Secondary | ICD-10-CM | POA: Diagnosis not present

## 2022-04-12 DIAGNOSIS — Z85828 Personal history of other malignant neoplasm of skin: Secondary | ICD-10-CM | POA: Diagnosis not present

## 2022-04-12 DIAGNOSIS — D225 Melanocytic nevi of trunk: Secondary | ICD-10-CM | POA: Diagnosis not present

## 2022-05-27 DIAGNOSIS — R8290 Unspecified abnormal findings in urine: Secondary | ICD-10-CM | POA: Diagnosis not present

## 2022-05-27 DIAGNOSIS — Z0000 Encounter for general adult medical examination without abnormal findings: Secondary | ICD-10-CM | POA: Diagnosis not present

## 2022-05-27 DIAGNOSIS — M810 Age-related osteoporosis without current pathological fracture: Secondary | ICD-10-CM | POA: Diagnosis not present

## 2022-05-27 DIAGNOSIS — E7800 Pure hypercholesterolemia, unspecified: Secondary | ICD-10-CM | POA: Diagnosis not present

## 2022-05-27 DIAGNOSIS — I700 Atherosclerosis of aorta: Secondary | ICD-10-CM | POA: Diagnosis not present

## 2022-05-27 DIAGNOSIS — H9193 Unspecified hearing loss, bilateral: Secondary | ICD-10-CM | POA: Diagnosis not present

## 2022-05-27 DIAGNOSIS — I77810 Thoracic aortic ectasia: Secondary | ICD-10-CM | POA: Diagnosis not present

## 2022-05-27 DIAGNOSIS — G4700 Insomnia, unspecified: Secondary | ICD-10-CM | POA: Diagnosis not present

## 2022-05-27 DIAGNOSIS — Z5181 Encounter for therapeutic drug level monitoring: Secondary | ICD-10-CM | POA: Diagnosis not present

## 2022-06-02 ENCOUNTER — Other Ambulatory Visit: Payer: Self-pay | Admitting: Internal Medicine

## 2022-06-02 DIAGNOSIS — Z13820 Encounter for screening for osteoporosis: Secondary | ICD-10-CM

## 2022-06-08 ENCOUNTER — Other Ambulatory Visit: Payer: Self-pay | Admitting: Internal Medicine

## 2022-06-08 DIAGNOSIS — I77810 Thoracic aortic ectasia: Secondary | ICD-10-CM

## 2022-06-23 ENCOUNTER — Inpatient Hospital Stay: Admission: RE | Admit: 2022-06-23 | Payer: Medicare Other | Source: Ambulatory Visit

## 2022-06-30 ENCOUNTER — Other Ambulatory Visit: Payer: Self-pay | Admitting: Internal Medicine

## 2022-06-30 DIAGNOSIS — Z1231 Encounter for screening mammogram for malignant neoplasm of breast: Secondary | ICD-10-CM

## 2022-07-23 ENCOUNTER — Ambulatory Visit
Admission: RE | Admit: 2022-07-23 | Discharge: 2022-07-23 | Disposition: A | Discharge status: Home / Self Care | Payer: Medicare Other | Source: Ambulatory Visit | Attending: Internal Medicine | Admitting: Internal Medicine

## 2022-07-23 DIAGNOSIS — I77810 Thoracic aortic ectasia: Secondary | ICD-10-CM

## 2022-07-23 DIAGNOSIS — I7121 Aneurysm of the ascending aorta, without rupture: Secondary | ICD-10-CM | POA: Diagnosis not present

## 2022-07-23 MED ORDER — IOPAMIDOL (ISOVUE-370) INJECTION 76%
75.0000 mL | Freq: Once | INTRAVENOUS | Status: AC | PRN
  Administered 2022-07-23: 75 mL via INTRAVENOUS

## 2022-08-31 ENCOUNTER — Ambulatory Visit
Admission: RE | Admit: 2022-08-31 | Discharge: 2022-08-31 | Disposition: A | Discharge status: Home / Self Care | Payer: Medicare Other | Source: Ambulatory Visit | Attending: Internal Medicine | Admitting: Internal Medicine

## 2022-08-31 DIAGNOSIS — Z1231 Encounter for screening mammogram for malignant neoplasm of breast: Secondary | ICD-10-CM

## 2022-10-19 DIAGNOSIS — L821 Other seborrheic keratosis: Secondary | ICD-10-CM | POA: Diagnosis not present

## 2022-10-19 DIAGNOSIS — Z85828 Personal history of other malignant neoplasm of skin: Secondary | ICD-10-CM | POA: Diagnosis not present

## 2022-11-08 DIAGNOSIS — Z974 Presence of external hearing-aid: Secondary | ICD-10-CM | POA: Diagnosis not present

## 2022-11-08 DIAGNOSIS — Z0110 Encounter for examination of ears and hearing without abnormal findings: Secondary | ICD-10-CM | POA: Diagnosis not present

## 2022-11-08 DIAGNOSIS — H9311 Tinnitus, right ear: Secondary | ICD-10-CM | POA: Diagnosis not present

## 2022-11-08 DIAGNOSIS — H903 Sensorineural hearing loss, bilateral: Secondary | ICD-10-CM | POA: Diagnosis not present

## 2023-01-21 DIAGNOSIS — H903 Sensorineural hearing loss, bilateral: Secondary | ICD-10-CM | POA: Diagnosis not present

## 2023-01-21 DIAGNOSIS — H9311 Tinnitus, right ear: Secondary | ICD-10-CM | POA: Diagnosis not present

## 2023-02-08 DIAGNOSIS — R8290 Unspecified abnormal findings in urine: Secondary | ICD-10-CM | POA: Diagnosis not present

## 2023-02-08 DIAGNOSIS — Z01419 Encounter for gynecological examination (general) (routine) without abnormal findings: Secondary | ICD-10-CM | POA: Diagnosis not present

## 2023-02-08 DIAGNOSIS — Z6821 Body mass index (BMI) 21.0-21.9, adult: Secondary | ICD-10-CM | POA: Diagnosis not present

## 2023-02-08 DIAGNOSIS — R319 Hematuria, unspecified: Secondary | ICD-10-CM | POA: Diagnosis not present

## 2023-02-22 DIAGNOSIS — H903 Sensorineural hearing loss, bilateral: Secondary | ICD-10-CM | POA: Diagnosis not present

## 2023-02-25 DIAGNOSIS — H52203 Unspecified astigmatism, bilateral: Secondary | ICD-10-CM | POA: Diagnosis not present

## 2023-02-25 DIAGNOSIS — H2513 Age-related nuclear cataract, bilateral: Secondary | ICD-10-CM | POA: Diagnosis not present

## 2023-03-29 DIAGNOSIS — H903 Sensorineural hearing loss, bilateral: Secondary | ICD-10-CM | POA: Diagnosis not present

## 2023-03-29 DIAGNOSIS — Z45321 Encounter for adjustment and management of cochlear device: Secondary | ICD-10-CM | POA: Diagnosis not present

## 2023-04-07 DIAGNOSIS — H903 Sensorineural hearing loss, bilateral: Secondary | ICD-10-CM | POA: Diagnosis not present

## 2023-04-07 DIAGNOSIS — H9311 Tinnitus, right ear: Secondary | ICD-10-CM | POA: Diagnosis not present

## 2023-04-29 DIAGNOSIS — H903 Sensorineural hearing loss, bilateral: Secondary | ICD-10-CM | POA: Diagnosis not present

## 2023-04-29 DIAGNOSIS — Z9621 Cochlear implant status: Secondary | ICD-10-CM | POA: Diagnosis not present

## 2023-04-29 DIAGNOSIS — H9311 Tinnitus, right ear: Secondary | ICD-10-CM | POA: Diagnosis not present

## 2023-04-29 DIAGNOSIS — Z4889 Encounter for other specified surgical aftercare: Secondary | ICD-10-CM | POA: Diagnosis not present

## 2023-05-09 DIAGNOSIS — L821 Other seborrheic keratosis: Secondary | ICD-10-CM | POA: Diagnosis not present

## 2023-05-09 DIAGNOSIS — Z85828 Personal history of other malignant neoplasm of skin: Secondary | ICD-10-CM | POA: Diagnosis not present

## 2023-05-09 DIAGNOSIS — L820 Inflamed seborrheic keratosis: Secondary | ICD-10-CM | POA: Diagnosis not present

## 2023-05-09 DIAGNOSIS — L812 Freckles: Secondary | ICD-10-CM | POA: Diagnosis not present

## 2023-05-09 DIAGNOSIS — L723 Sebaceous cyst: Secondary | ICD-10-CM | POA: Diagnosis not present

## 2023-05-09 DIAGNOSIS — D225 Melanocytic nevi of trunk: Secondary | ICD-10-CM | POA: Diagnosis not present

## 2023-06-07 DIAGNOSIS — Z45321 Encounter for adjustment and management of cochlear device: Secondary | ICD-10-CM | POA: Diagnosis not present

## 2023-06-07 DIAGNOSIS — H9193 Unspecified hearing loss, bilateral: Secondary | ICD-10-CM | POA: Diagnosis not present

## 2023-06-07 DIAGNOSIS — Z9621 Cochlear implant status: Secondary | ICD-10-CM | POA: Diagnosis not present

## 2023-06-22 ENCOUNTER — Other Ambulatory Visit: Payer: Self-pay | Admitting: Internal Medicine

## 2023-06-22 DIAGNOSIS — I700 Atherosclerosis of aorta: Secondary | ICD-10-CM | POA: Diagnosis not present

## 2023-06-22 DIAGNOSIS — E559 Vitamin D deficiency, unspecified: Secondary | ICD-10-CM | POA: Diagnosis not present

## 2023-06-22 DIAGNOSIS — M2550 Pain in unspecified joint: Secondary | ICD-10-CM | POA: Diagnosis not present

## 2023-06-22 DIAGNOSIS — Z23 Encounter for immunization: Secondary | ICD-10-CM | POA: Diagnosis not present

## 2023-06-22 DIAGNOSIS — I77810 Thoracic aortic ectasia: Secondary | ICD-10-CM

## 2023-06-22 DIAGNOSIS — E7800 Pure hypercholesterolemia, unspecified: Secondary | ICD-10-CM | POA: Diagnosis not present

## 2023-06-22 DIAGNOSIS — M810 Age-related osteoporosis without current pathological fracture: Secondary | ICD-10-CM | POA: Diagnosis not present

## 2023-06-22 DIAGNOSIS — H9193 Unspecified hearing loss, bilateral: Secondary | ICD-10-CM | POA: Diagnosis not present

## 2023-06-22 DIAGNOSIS — Z0000 Encounter for general adult medical examination without abnormal findings: Secondary | ICD-10-CM | POA: Diagnosis not present

## 2023-06-22 DIAGNOSIS — Z5181 Encounter for therapeutic drug level monitoring: Secondary | ICD-10-CM | POA: Diagnosis not present

## 2023-07-07 ENCOUNTER — Ambulatory Visit
Admission: RE | Admit: 2023-07-07 | Discharge: 2023-07-07 | Disposition: A | Discharge status: Home / Self Care | Payer: Medicare Other | Source: Ambulatory Visit | Attending: Internal Medicine | Admitting: Internal Medicine

## 2023-07-07 DIAGNOSIS — I77810 Thoracic aortic ectasia: Secondary | ICD-10-CM

## 2023-07-07 DIAGNOSIS — I700 Atherosclerosis of aorta: Secondary | ICD-10-CM | POA: Diagnosis not present

## 2023-07-07 DIAGNOSIS — I7121 Aneurysm of the ascending aorta, without rupture: Secondary | ICD-10-CM | POA: Diagnosis not present

## 2023-07-07 MED ORDER — IOPAMIDOL (ISOVUE-370) INJECTION 76%
100.0000 mL | Freq: Once | INTRAVENOUS | Status: AC | PRN
  Administered 2023-07-07: 75 mL via INTRAVENOUS

## 2023-07-27 ENCOUNTER — Other Ambulatory Visit: Payer: Self-pay | Admitting: Internal Medicine

## 2023-07-27 DIAGNOSIS — Z1231 Encounter for screening mammogram for malignant neoplasm of breast: Secondary | ICD-10-CM

## 2023-09-02 ENCOUNTER — Ambulatory Visit
Admission: RE | Admit: 2023-09-02 | Discharge: 2023-09-02 | Disposition: A | Discharge status: Home / Self Care | Payer: Medicare Other | Source: Ambulatory Visit | Attending: Internal Medicine | Admitting: Internal Medicine

## 2023-09-02 DIAGNOSIS — Z1231 Encounter for screening mammogram for malignant neoplasm of breast: Secondary | ICD-10-CM

## 2023-11-03 DIAGNOSIS — Z4889 Encounter for other specified surgical aftercare: Secondary | ICD-10-CM | POA: Diagnosis not present

## 2023-11-03 DIAGNOSIS — H9311 Tinnitus, right ear: Secondary | ICD-10-CM | POA: Diagnosis not present

## 2023-11-03 DIAGNOSIS — H903 Sensorineural hearing loss, bilateral: Secondary | ICD-10-CM | POA: Diagnosis not present

## 2023-11-03 DIAGNOSIS — R432 Parageusia: Secondary | ICD-10-CM | POA: Diagnosis not present

## 2023-11-03 DIAGNOSIS — Z9621 Cochlear implant status: Secondary | ICD-10-CM | POA: Diagnosis not present

## 2023-11-15 DIAGNOSIS — H2512 Age-related nuclear cataract, left eye: Secondary | ICD-10-CM | POA: Diagnosis not present

## 2024-04-03 DIAGNOSIS — Z6822 Body mass index (BMI) 22.0-22.9, adult: Secondary | ICD-10-CM | POA: Diagnosis not present

## 2024-04-03 DIAGNOSIS — Z01419 Encounter for gynecological examination (general) (routine) without abnormal findings: Secondary | ICD-10-CM | POA: Diagnosis not present

## 2024-05-10 DIAGNOSIS — Z9621 Cochlear implant status: Secondary | ICD-10-CM | POA: Diagnosis not present

## 2024-05-10 DIAGNOSIS — R432 Parageusia: Secondary | ICD-10-CM | POA: Diagnosis not present

## 2024-05-10 DIAGNOSIS — Z4889 Encounter for other specified surgical aftercare: Secondary | ICD-10-CM | POA: Diagnosis not present

## 2024-05-10 DIAGNOSIS — H9311 Tinnitus, right ear: Secondary | ICD-10-CM | POA: Diagnosis not present

## 2024-05-10 DIAGNOSIS — H903 Sensorineural hearing loss, bilateral: Secondary | ICD-10-CM | POA: Diagnosis not present

## 2024-05-10 DIAGNOSIS — Z461 Encounter for fitting and adjustment of hearing aid: Secondary | ICD-10-CM | POA: Diagnosis not present

## 2024-05-15 DIAGNOSIS — D225 Melanocytic nevi of trunk: Secondary | ICD-10-CM | POA: Diagnosis not present

## 2024-05-15 DIAGNOSIS — L820 Inflamed seborrheic keratosis: Secondary | ICD-10-CM | POA: Diagnosis not present

## 2024-05-15 DIAGNOSIS — Z85828 Personal history of other malignant neoplasm of skin: Secondary | ICD-10-CM | POA: Diagnosis not present

## 2024-05-15 DIAGNOSIS — C44519 Basal cell carcinoma of skin of other part of trunk: Secondary | ICD-10-CM | POA: Diagnosis not present

## 2024-05-15 DIAGNOSIS — D1801 Hemangioma of skin and subcutaneous tissue: Secondary | ICD-10-CM | POA: Diagnosis not present

## 2024-05-15 DIAGNOSIS — D485 Neoplasm of uncertain behavior of skin: Secondary | ICD-10-CM | POA: Diagnosis not present

## 2024-05-15 DIAGNOSIS — R208 Other disturbances of skin sensation: Secondary | ICD-10-CM | POA: Diagnosis not present

## 2024-05-15 DIAGNOSIS — L821 Other seborrheic keratosis: Secondary | ICD-10-CM | POA: Diagnosis not present

## 2024-06-11 DIAGNOSIS — F411 Generalized anxiety disorder: Secondary | ICD-10-CM | POA: Diagnosis not present

## 2024-06-11 DIAGNOSIS — R030 Elevated blood-pressure reading, without diagnosis of hypertension: Secondary | ICD-10-CM | POA: Diagnosis not present

## 2024-06-11 DIAGNOSIS — G4700 Insomnia, unspecified: Secondary | ICD-10-CM | POA: Diagnosis not present

## 2024-07-11 DIAGNOSIS — I77810 Thoracic aortic ectasia: Secondary | ICD-10-CM | POA: Diagnosis not present

## 2024-07-11 DIAGNOSIS — R030 Elevated blood-pressure reading, without diagnosis of hypertension: Secondary | ICD-10-CM | POA: Diagnosis not present

## 2024-07-11 DIAGNOSIS — E559 Vitamin D deficiency, unspecified: Secondary | ICD-10-CM | POA: Diagnosis not present

## 2024-07-11 DIAGNOSIS — E7800 Pure hypercholesterolemia, unspecified: Secondary | ICD-10-CM | POA: Diagnosis not present

## 2024-07-11 DIAGNOSIS — F411 Generalized anxiety disorder: Secondary | ICD-10-CM | POA: Diagnosis not present

## 2024-07-11 DIAGNOSIS — H9193 Unspecified hearing loss, bilateral: Secondary | ICD-10-CM | POA: Diagnosis not present

## 2024-07-11 DIAGNOSIS — I700 Atherosclerosis of aorta: Secondary | ICD-10-CM | POA: Diagnosis not present

## 2024-07-11 DIAGNOSIS — M810 Age-related osteoporosis without current pathological fracture: Secondary | ICD-10-CM | POA: Diagnosis not present

## 2024-07-11 DIAGNOSIS — Z0000 Encounter for general adult medical examination without abnormal findings: Secondary | ICD-10-CM | POA: Diagnosis not present

## 2024-07-11 DIAGNOSIS — Z5181 Encounter for therapeutic drug level monitoring: Secondary | ICD-10-CM | POA: Diagnosis not present

## 2024-07-11 DIAGNOSIS — G4700 Insomnia, unspecified: Secondary | ICD-10-CM | POA: Diagnosis not present

## 2024-07-20 ENCOUNTER — Other Ambulatory Visit: Payer: Self-pay | Admitting: Internal Medicine

## 2024-07-20 DIAGNOSIS — Z1231 Encounter for screening mammogram for malignant neoplasm of breast: Secondary | ICD-10-CM

## 2024-09-04 ENCOUNTER — Ambulatory Visit
Admission: RE | Admit: 2024-09-04 | Discharge: 2024-09-04 | Disposition: A | Source: Ambulatory Visit | Attending: Internal Medicine | Admitting: Internal Medicine

## 2024-09-04 DIAGNOSIS — Z1231 Encounter for screening mammogram for malignant neoplasm of breast: Secondary | ICD-10-CM
